# Patient Record
Sex: Female | Born: 1970 | Race: Asian | Hispanic: No | Marital: Married | Smoking: Never smoker
Health system: Southern US, Community
[De-identification: ages and names within clinical notes are randomized; demographics above are authoritative.]

## PROBLEM LIST (undated history)

## (undated) DIAGNOSIS — T7840XA Allergy, unspecified, initial encounter: Secondary | ICD-10-CM

## (undated) DIAGNOSIS — K219 Gastro-esophageal reflux disease without esophagitis: Secondary | ICD-10-CM

## (undated) DIAGNOSIS — R51 Headache: Secondary | ICD-10-CM

## (undated) DIAGNOSIS — R519 Headache, unspecified: Secondary | ICD-10-CM

## (undated) DIAGNOSIS — Z9889 Other specified postprocedural states: Secondary | ICD-10-CM

## (undated) HISTORY — DX: Allergy, unspecified, initial encounter: T78.40XA

## (undated) HISTORY — DX: Headache: R51

## (undated) HISTORY — DX: Headache, unspecified: R51.9

## (undated) HISTORY — DX: Gastro-esophageal reflux disease without esophagitis: K21.9

## (undated) HISTORY — DX: Other specified postprocedural states: Z98.890

---

## 2016-06-25 DIAGNOSIS — Z1231 Encounter for screening mammogram for malignant neoplasm of breast: Secondary | ICD-10-CM | POA: Diagnosis not present

## 2016-06-25 DIAGNOSIS — Z01419 Encounter for gynecological examination (general) (routine) without abnormal findings: Secondary | ICD-10-CM | POA: Diagnosis not present

## 2017-03-03 ENCOUNTER — Telehealth: Payer: Self-pay | Admitting: Internal Medicine

## 2017-03-03 NOTE — Telephone Encounter (Signed)
Copied from [place] [id number]. Topic: Appointment Scheduling - Scheduling Inquiry for Clinic >> Mar 03, 2017 11:22 AM Ether Griffins B wrote: Reason for CRM: pt would like to establish with Dr. Alain Marion. She was told he would make exceptions for russian speaking patients.

## 2017-03-03 NOTE — Telephone Encounter (Signed)
Would you be willing to see this patient to establish care?

## 2017-03-07 NOTE — Telephone Encounter (Signed)
Okay. Thank you.

## 2017-03-10 NOTE — Telephone Encounter (Signed)
Appointment scheduled.

## 2017-03-17 ENCOUNTER — Ambulatory Visit (INDEPENDENT_AMBULATORY_CARE_PROVIDER_SITE_OTHER): Payer: 59 | Admitting: Internal Medicine

## 2017-03-17 ENCOUNTER — Encounter: Payer: Self-pay | Admitting: Internal Medicine

## 2017-03-17 ENCOUNTER — Other Ambulatory Visit (INDEPENDENT_AMBULATORY_CARE_PROVIDER_SITE_OTHER): Payer: 59

## 2017-03-17 DIAGNOSIS — E039 Hypothyroidism, unspecified: Secondary | ICD-10-CM

## 2017-03-17 DIAGNOSIS — J309 Allergic rhinitis, unspecified: Secondary | ICD-10-CM

## 2017-03-17 DIAGNOSIS — K5900 Constipation, unspecified: Secondary | ICD-10-CM

## 2017-03-17 DIAGNOSIS — R635 Abnormal weight gain: Secondary | ICD-10-CM

## 2017-03-17 LAB — BASIC METABOLIC PANEL
BUN: 10 mg/dL (ref 6–23)
CO2: 26 mEq/L (ref 19–32)
CREATININE: 0.76 mg/dL (ref 0.40–1.20)
Calcium: 9.4 mg/dL (ref 8.4–10.5)
Chloride: 103 mEq/L (ref 96–112)
GFR: 87.35 mL/min (ref >60.00)
Glucose, Bld: 96 mg/dL (ref 70–99)
Potassium: 4.1 mEq/L (ref 3.5–5.1)
Sodium: 139 mEq/L (ref 135–145)

## 2017-03-17 LAB — T3, FREE: T3, Free: 3.3 pg/mL (ref 2.3–4.2)

## 2017-03-17 LAB — CBC WITH DIFFERENTIAL/PLATELET
BASOS ABS: 0.1 10*3/uL (ref 0.0–0.1)
Basophils Relative: 1.1 % (ref 0.0–3.0)
Eosinophils Absolute: 0.1 10*3/uL (ref 0.0–0.7)
Eosinophils Relative: 1.3 % (ref 0.0–5.0)
HCT: 41.9 % (ref 36.0–46.0)
Hemoglobin: 14 g/dL (ref 12.0–15.0)
LYMPHS ABS: 2.2 10*3/uL (ref 0.7–4.0)
Lymphocytes Relative: 29.3 % (ref 12.0–46.0)
MCHC: 33.4 g/dL (ref 30.0–36.0)
MCV: 88.4 fl (ref 78.0–100.0)
MONOS PCT: 6.9 % (ref 3.0–12.0)
Monocytes Absolute: 0.5 10*3/uL (ref 0.1–1.0)
NEUTROS ABS: 4.7 10*3/uL (ref 1.4–7.7)
NEUTROS PCT: 61.4 % (ref 43.0–77.0)
PLATELETS: 242 10*3/uL (ref 150.0–400.0)
RBC: 4.74 Mil/uL (ref 3.87–5.11)
RDW: 13.4 % (ref 11.5–15.5)
WBC: 7.6 10*3/uL (ref 4.0–10.5)

## 2017-03-17 LAB — URINALYSIS
BILIRUBIN URINE: NEGATIVE
Hgb urine dipstick: NEGATIVE
Ketones, ur: 15 — AB
LEUKOCYTES UA: NEGATIVE
NITRITE: NEGATIVE
PH: 6 (ref 5.0–8.0)
SPECIFIC GRAVITY, URINE: 1.025 (ref 1.000–1.030)
Total Protein, Urine: NEGATIVE
Urine Glucose: NEGATIVE
Urobilinogen, UA: 0.2 (ref 0.0–1.0)

## 2017-03-17 LAB — HEPATIC FUNCTION PANEL
ALBUMIN: 4.4 g/dL (ref 3.5–5.2)
ALT: 11 U/L (ref 0–35)
AST: 19 U/L (ref 0–37)
Alkaline Phosphatase: 55 U/L (ref 39–117)
BILIRUBIN TOTAL: 0.4 mg/dL (ref 0.2–1.2)
Bilirubin, Direct: 0 mg/dL (ref 0.0–0.3)
Total Protein: 7.7 g/dL (ref 6.0–8.3)

## 2017-03-17 LAB — LIPID PANEL
CHOL/HDL RATIO: 3
Cholesterol: 158 mg/dL (ref 0–200)
HDL: 51.8 mg/dL (ref >39.00)
LDL CALC: 93 mg/dL (ref 0–99)
NonHDL: 106.26
TRIGLYCERIDES: 67 mg/dL (ref 0.0–149.0)
VLDL: 13.4 mg/dL (ref 0.0–40.0)

## 2017-03-17 LAB — H. PYLORI ANTIBODY, IGG: H PYLORI IGG: POSITIVE — AB

## 2017-03-17 LAB — VITAMIN D 25 HYDROXY (VIT D DEFICIENCY, FRACTURES): VITD: 24.98 ng/mL — AB (ref 30.00–100.00)

## 2017-03-17 LAB — T4, FREE: Free T4: 0.95 ng/dL (ref 0.60–1.60)

## 2017-03-17 LAB — VITAMIN B12: Vitamin B-12: 515 pg/mL (ref 211–911)

## 2017-03-17 LAB — TSH: TSH: 3.37 u[IU]/mL (ref 0.35–4.50)

## 2017-03-17 MED ORDER — LORATADINE 10 MG PO TABS: 10.0000 mg | ORAL_TABLET | Freq: Every day | ORAL | 3 refills | Status: AC

## 2017-03-17 MED ORDER — POLYETHYLENE GLYCOL 3350 17 GM/SCOOP PO POWD
17.0000 g | Freq: Two times a day (BID) | ORAL | 11 refills | Status: DC | PRN
Start: 2017-03-17 — End: 2020-05-29

## 2017-03-17 NOTE — Progress Notes (Signed)
   Subjective:  Patient ID: Dawn [name], female    DOB: [birth year]  Age: 47 y.o. MRN: [id number]  CC: No chief complaint on file.   HPI   Dashanique Rhody presents for a new pt visit C/o hypothyroidism since last summer. The pt had an [place] - mild goiter and a low FT3 in [place]. She ran out of her thyroid Rx since Nov 2018 C/o memory issues, wt gain, constipation off her thyroid Rx   Outpatient Medications Prior to Visit  Medication Sig Dispense Refill  . levonorgestrel (MIRENA, 52 MG,) 20 MCG/24HR IUD Mirena 20 mcg/24 hr (5 years) intrauterine device  Take 1 device by intrauterine route.     No facility-administered medications prior to visit.     ROS Review of Systems  Constitutional: Negative for activity change, appetite change, chills, fatigue and unexpected weight change.  HENT: Negative for congestion, mouth sores and sinus pressure.   Eyes: Negative for visual disturbance.  Respiratory: Negative for cough and chest tightness.   Gastrointestinal: Negative for abdominal pain and nausea.  Genitourinary: Negative for difficulty urinating, frequency and vaginal pain.  Musculoskeletal: Negative for back pain and gait problem.  Skin: Negative for pallor and rash.  Neurological: Negative for dizziness, tremors, weakness, numbness and headaches.  Psychiatric/Behavioral: Negative for confusion, sleep disturbance and suicidal ideas.    Objective:  BP 118/62 (BP Location: Left Arm, Patient Position: Sitting, Cuff Size: Large)   Pulse 72   Temp 98.2 F (36.8 C) (Oral)   Ht 5' 5.25" (1.657 m)   Wt 158 lb (71.7 kg)   SpO2 99%   BMI 26.09 kg/m   BP Readings from Last 3 Encounters:  03/17/17 118/62    Wt Readings from Last 3 Encounters:  03/17/17 158 lb (71.7 kg)    Physical Exam  Constitutional: She appears well-developed. No distress.  HENT:  Head: Normocephalic.  Right Ear: External ear normal.  Left Ear: External ear normal.  Nose: Nose normal.    Mouth/Throat: Oropharynx is clear and moist.  Eyes: Conjunctivae are normal. Pupils are equal, round, and reactive to light. Right eye exhibits no discharge. Left eye exhibits no discharge.  Neck: Normal range of motion. Neck supple. No JVD present. No tracheal deviation present. No thyromegaly present.  Cardiovascular: Normal rate, regular rhythm and normal heart sounds.  Pulmonary/Chest: No stridor. No respiratory distress. She has no wheezes.  Abdominal: Soft. Bowel sounds are normal. She exhibits no distension and no mass. There is no tenderness. There is no rebound and no guarding.  Musculoskeletal: She exhibits tenderness. She exhibits no edema.  Lymphadenopathy:    She has no cervical adenopathy.  Neurological: She displays normal reflexes. No cranial nerve deficit. She exhibits normal muscle tone. Coordination normal.  Skin: No rash noted. No erythema.  Psychiatric: She has a normal mood and affect. Her behavior is normal. Judgment and thought content normal.  mild goiter NT  No results found for: WBC, HGB, HCT, PLT, GLUCOSE, CHOL, TRIG, HDL, LDLDIRECT, LDLCALC, ALT, AST, NA, K, CL, CREATININE, BUN, CO2, TSH, PSA, INR, GLUF, HGBA1C, MICROALBUR  Patient was never admitted.  Assessment & Plan:   There are no diagnoses linked to this encounter. I am having Kandyce Mcphie maintain her levonorgestrel.  No orders of the defined types were placed in this encounter.    Follow-up: No Follow-up on file.  Walker Kehr, MD

## 2017-03-17 NOTE — Assessment & Plan Note (Signed)
Claritin

## 2017-03-17 NOTE — Assessment & Plan Note (Addendum)
Intermittent  Labs  Miralax po. May need a GI ref

## 2017-03-17 NOTE — Patient Instructions (Signed)
Use [employer] Peroxicare tooth paste   Gluten free trial for 4-6 weeks. OK to use gluten-free bread and gluten-free pasta.    Gluten-Free Diet for Celiac Disease, Adult The gluten-free diet includes all foods that do not contain gluten. Gluten is a protein that is found in wheat, rye, barley, and some other grains. Following the gluten-free diet is the only treatment for people with celiac disease. It helps to prevent damage to the intestines and improves or eliminates the symptoms of celiac disease. Following the gluten-free diet requires some planning. It can be challenging at first, but it gets easier with time and practice. There are more gluten-free options available today than ever before. If you need help finding gluten-free foods or if you have questions, talk with your diet and nutrition specialist (registered dietitian) or your health care provider. What do I need to know about a gluten-free diet?  All fruits, vegetables, and meats are safe to eat and do not contain gluten.  When grocery shopping, start by shopping in the produce, meat, and dairy sections. These sections are more likely to contain gluten-free foods. Then move to the aisles that contain packaged foods if you need to.  Read all food labels. Gluten is often added to foods. Always check the ingredient list and look for warnings, such as "may contain gluten."  Talk with your dietitian or health care provider before taking a gluten-free multivitamin or mineral supplement.  Be aware of gluten-free foods having contact with foods that contain gluten (cross-contamination). This can happen at home and with any processed foods. ? Talk with your health care provider or dietitian about how to reduce the risk of cross-contamination in your home. ? If you have questions about how a food is processed, ask the manufacturer. What key words help to identify gluten? Foods that list any of these key words on the label usually contain  gluten:  Wheat, flour, enriched flour, bromated flour, white flour, durum flour, graham flour, phosphated flour, self-rising flour, semolina, farina, barley (malt), rye, and oats.  Starch, dextrin, modified food starch, or cereal.  Thickening, fillers, or emulsifiers.  Malt flavoring, malt extract, or malt syrup.  Hydrolyzed vegetable protein.  In the [place] packaged foods that are gluten-free are required to be labeled "GF." These foods should be easy to identify and are safe to eat. In the [place] food companies are also required to list common food allergens, including wheat, on their labels. Recommended foods Grains  Amaranth, bean flours, 100% buckwheat flour, corn, millet, nut flours or nut meals, GF oats, quinoa, rice, sorghum, teff, rice wafers, pure cornmeal tortillas, popcorn, and hot cereals made from cornmeal. Hominy, rice, wild rice. Some Asian rice noodles or bean noodles. Arrowroot starch, corn bran, corn flour, corn germ, cornmeal, corn starch, potato flour, potato starch flour, and rice bran. Plain, brown, and sweet rice flours. Rice polish, soy flour, and tapioca starch. Vegetables  All plain fresh, frozen, and canned vegetables. Fruits  All plain fresh, frozen, canned, and dried fruits, and 100% fruit juices. Meats and other protein foods  All fresh beef, pork, poultry, fish, seafood, and eggs. Fish canned in water, oil, brine, or vegetable broth. Plain nuts and seeds, peanut butter. Some lunch meat and some frankfurters. Dried beans, dried peas, and lentils. Dairy  Fresh plain, dry, evaporated, or condensed milk. Cream, butter, sour cream, whipping cream, and most yogurts. Unprocessed cheese, most processed cheeses, some cottage cheese, some cream cheeses. Beverages  Coffee, tea, most herbal teas. Carbonated  beverages and some root beers. Wine, sake, and distilled spirits, such as gin, vodka, and whiskey. Most hard ciders. Fats and oils  Butter, margarine, vegetable  oil, hydrogenated butter, olive oil, shortening, lard, cream, and some mayonnaise. Some commercial salad dressings. Olives. Sweets and desserts  Sugar, honey, some syrups, molasses, jelly, and jam. Plain hard candy, marshmallows, and gumdrops. Pure cocoa powder. Plain chocolate. Custard and some pudding mixes. Gelatin desserts, sorbets, frozen ice pops, and sherbet. Cake, cookies, and other desserts prepared with allowed flours. Some commercial ice creams. Cornstarch, tapioca, and rice puddings. Seasoning and other foods  Some canned or frozen soups. Monosodium glutamate (MSG). Cider, rice, and wine vinegar. Baking soda and baking powder. Cream of tartar. Baking and nutritional yeast. Certain soy sauces made without wheat (ask your dietitian about specific brands that are allowed). Nuts, coconut, and chocolate. Salt, pepper, herbs, spices, flavoring extracts, imitation or artificial flavorings, natural flavorings, and food colorings. Some medicines and supplements. Some lip glosses and other cosmetics. Rice syrups. The items listed may not be a complete list. Talk with your dietitian about what dietary choices are best for you. Foods to avoid Grains  Barley, bran, bulgur, couscous, cracked wheat, [place], farro, graham, malt, matzo, semolina, wheat germ, and all wheat and rye cereals including spelt and kamut. Cereals containing malt as a flavoring, such as rice cereal. Noodles, spaghetti, macaroni, most packaged rice mixes, and all mixes containing wheat, rye, barley, or triticale. Vegetables  Most creamed vegetables and most vegetables canned in sauces. Some commercially prepared vegetables and salads. Fruits  Thickened or prepared fruits and some pie fillings. Some fruit snacks and fruit roll-ups. Meats and other protein foods  Any meat or meat alternative containing wheat, rye, barley, or gluten stabilizers. These are often marinated or packaged meats and lunch meats. Bread-containing  products, such as Swiss steak, croquettes, meatballs, and meatloaf. Most tuna canned in vegetable broth and [place] with hydrolyzed vegetable protein (HVP) injected as part of the basting. Seitan. Imitation fish. Eggs in sauces made from ingredients to avoid. Dairy  Commercial chocolate milk drinks and malted milk. Some non-dairy creamers. Any cheese product containing ingredients to avoid. Beverages  Certain cereal beverages. Beer, ale, malted milk, and some root beers. Some hard ciders. Some instant flavored coffees. Some herbal teas made with barley or with barley malt added. Fats and oils  Some commercial salad dressings. Sour cream containing modified food starch. Sweets and desserts  Some toffees. Chocolate-coated nuts (may be rolled in wheat flour) and some commercial candies and candy bars. Most cakes, cookies, donuts, pastries, and other baked goods. Some commercial ice cream. Ice cream cones. Commercially prepared mixes for cakes, cookies, and other desserts. Bread pudding and other puddings thickened with flour. Products containing brown rice syrup made with barley malt enzyme. Desserts and sweets made with malt flavoring. Seasoning and other foods  Some curry powders, some dry seasoning mixes, some gravy extracts, some meat sauces, some ketchups, some prepared mustards, and horseradish. Certain soy sauces. Malt vinegar. Bouillon and bouillon cubes that contain HVP. Some chip dips, and some chewing gum. Yeast extract. Brewer's yeast. Caramel color. Some medicines and supplements. Some lip glosses and other cosmetics. The items listed may not be a complete list. Talk with your dietitian about what dietary choices are best for you. Summary  Gluten is a protein that is found in wheat, rye, barley, and some other grains. The gluten-free diet includes all foods that do not contain gluten.  If you need help  finding gluten-free foods or if you have questions, talk with your diet and nutrition  specialist (registered dietitian) or your health care provider.  Read all food labels. Gluten is often added to foods. Always check the ingredient list and look for warnings, such as "may contain gluten." This information is not intended to replace advice given to you by your health care provider. Make sure you discuss any questions you have with your health care provider. Document Released: 01/19/2005 Document Revised: 11/04/2015 Document Reviewed: 11/04/2015 Elsevier Interactive Patient Education  2018 [employer].

## 2017-03-17 NOTE — Assessment & Plan Note (Signed)
Labs

## 2017-03-17 NOTE — Assessment & Plan Note (Signed)
Hypothyroidism since last summer 2018. The pt had an [place] - mild goiter and a low FT3 in [place]. She ran out of her thyroid Rx since Nov 2018 She was on Levothr 50 mc/g x 3 mo   Labs

## 2017-03-20 ENCOUNTER — Other Ambulatory Visit: Payer: Self-pay | Admitting: Internal Medicine

## 2017-03-20 MED ORDER — METRONIDAZOLE 500 MG PO TABS: 500.0000 mg | ORAL_TABLET | Freq: Three times a day (TID) | ORAL | 0 refills | Status: DC

## 2017-03-20 MED ORDER — VITAMIN D3 1.25 MG (50000 UT) PO CAPS: 1.0000 | ORAL_CAPSULE | ORAL | 0 refills | Status: DC

## 2017-03-20 MED ORDER — CLARITHROMYCIN 500 MG PO TABS: 500.0000 mg | ORAL_TABLET | Freq: Two times a day (BID) | ORAL | 0 refills | Status: AC

## 2017-03-20 MED ORDER — PANTOPRAZOLE SODIUM 40 MG PO TBEC: 40.0000 mg | DELAYED_RELEASE_TABLET | Freq: Two times a day (BID) | ORAL | 1 refills | Status: DC

## 2017-03-20 MED ORDER — VITAMIN D3 50 MCG (2000 UT) PO CAPS: 2000.0000 [IU] | ORAL_CAPSULE | Freq: Every day | ORAL | 3 refills | Status: AC

## 2017-04-28 ENCOUNTER — Ambulatory Visit: Payer: 59 | Admitting: Internal Medicine

## 2017-04-28 ENCOUNTER — Encounter: Payer: Self-pay | Admitting: Internal Medicine

## 2017-04-28 VITALS — BP 116/58 | HR 63 | Temp 98.2°F | Ht 65.25 in | Wt 162.0 lb

## 2017-04-28 DIAGNOSIS — E039 Hypothyroidism, unspecified: Secondary | ICD-10-CM

## 2017-04-28 DIAGNOSIS — K5900 Constipation, unspecified: Secondary | ICD-10-CM | POA: Diagnosis not present

## 2017-04-28 DIAGNOSIS — B9681 Helicobacter pylori [H. pylori] as the cause of diseases classified elsewhere: Secondary | ICD-10-CM | POA: Diagnosis not present

## 2017-04-28 DIAGNOSIS — K2950 Unspecified chronic gastritis without bleeding: Secondary | ICD-10-CM

## 2017-04-28 DIAGNOSIS — R1013 Epigastric pain: Secondary | ICD-10-CM

## 2017-04-28 DIAGNOSIS — E559 Vitamin D deficiency, unspecified: Secondary | ICD-10-CM | POA: Insufficient documentation

## 2017-04-28 MED ORDER — HYOSCYAMINE SULFATE 0.125 MG PO TABS: 0.1250 mg | ORAL_TABLET | ORAL | 3 refills | Status: DC | PRN

## 2017-04-28 NOTE — Assessment & Plan Note (Signed)
On Rx 

## 2017-04-28 NOTE — Assessment & Plan Note (Addendum)
Treated w/abx - better Abd [place] GI ref if not better

## 2017-04-28 NOTE — Assessment & Plan Note (Signed)
On Vit D 

## 2017-04-28 NOTE — Assessment & Plan Note (Signed)
Better on gluten free diet 

## 2017-04-28 NOTE — Progress Notes (Signed)
Subjective:  Patient ID: Dawn [name], female    DOB: 1970/11/08  Age: 47 y.o. MRN: [id number]  CC: No chief complaint on file.   HPI Dawn [name] presents for H pylori, epig pain, ?gluten intolerance f/u. Took abx. C/o occasional epigastric pain, gas - better...   Outpatient Medications Prior to Visit  Medication Sig Dispense Refill  . Cholecalciferol (VITAMIN D3) 2000 units capsule Take 1 capsule (2,000 Units total) by mouth daily. 100 capsule 3  . levonorgestrel (MIRENA, 52 MG,) 20 MCG/24HR IUD Mirena 20 mcg/24 hr (5 years) intrauterine device  Take 1 device by intrauterine route.    . loratadine (CLARITIN) 10 MG tablet Take 1 tablet (10 mg total) by mouth daily. 90 tablet 3  . pantoprazole (PROTONIX) 40 MG tablet Take 1 tablet (40 mg total) by mouth 2 (two) times daily. 60 tablet 1  . polyethylene glycol powder (GLYCOLAX/MIRALAX) powder Take 17 g by mouth 2 (two) times daily as needed for moderate constipation. 500 g 11  . Cholecalciferol (VITAMIN D3) 50000 units CAPS Take 1 capsule by mouth once a week. 6 capsule 0  . metroNIDAZOLE (FLAGYL) 500 MG tablet Take 1 tablet (500 mg total) by mouth 3 (three) times daily. 42 tablet 0   No facility-administered medications prior to visit.     ROS Review of Systems  Constitutional: Negative for activity change, appetite change, chills, fatigue and unexpected weight change.  HENT: Negative for congestion, mouth sores and sinus pressure.   Eyes: Negative for visual disturbance.  Respiratory: Negative for cough and chest tightness.   Gastrointestinal: Positive for abdominal pain. Negative for nausea.  Genitourinary: Negative for difficulty urinating, frequency and vaginal pain.  Musculoskeletal: Negative for back pain and gait problem.  Skin: Negative for pallor and rash.  Neurological: Negative for dizziness, tremors, weakness, numbness and headaches.  Psychiatric/Behavioral: Negative for confusion and sleep disturbance.     Objective:  BP (!) 116/58 (BP Location: Left Arm, Patient Position: Sitting, Cuff Size: Large)   Pulse 63   Temp 98.2 F (36.8 C) (Oral)   Ht 5' 5.25" (1.657 m)   Wt 162 lb (73.5 kg)   SpO2 99%   BMI 26.75 kg/m   BP Readings from Last 3 Encounters:  04/28/17 (!) 116/58  03/17/17 118/62    Wt Readings from Last 3 Encounters:  04/28/17 162 lb (73.5 kg)  03/17/17 158 lb (71.7 kg)    Physical Exam  Constitutional: She appears well-developed. No distress.  HENT:  Head: Normocephalic.  Right Ear: External ear normal.  Left Ear: External ear normal.  Nose: Nose normal.  Mouth/Throat: Oropharynx is clear and moist.  Eyes: Pupils are equal, round, and reactive to light. Conjunctivae are normal. Right eye exhibits no discharge. Left eye exhibits no discharge.  Neck: Normal range of motion. Neck supple. No JVD present. No tracheal deviation present. No thyromegaly present.  Cardiovascular: Normal rate, regular rhythm and normal heart sounds.  Pulmonary/Chest: No stridor. No respiratory distress. She has no wheezes.  Abdominal: Soft. Bowel sounds are normal. She exhibits no distension and no mass. There is no tenderness. There is no rebound and no guarding.  Musculoskeletal: She exhibits no edema or tenderness.  Lymphadenopathy:    She has no cervical adenopathy.  Neurological: She displays normal reflexes. No cranial nerve deficit. She exhibits normal muscle tone. Coordination normal.  Skin: No rash noted. No erythema.  Psychiatric: She has a normal mood and affect. Her behavior is normal. Judgment and thought content normal.  Lab Results  Component Value Date   WBC 7.6 03/17/2017   HGB 14.0 03/17/2017   HCT 41.9 03/17/2017   PLT 242.0 03/17/2017   GLUCOSE 96 03/17/2017   CHOL 158 03/17/2017   TRIG 67.0 03/17/2017   HDL 51.80 03/17/2017   LDLCALC 93 03/17/2017   ALT 11 03/17/2017   AST 19 03/17/2017   NA 139 03/17/2017   K 4.1 03/17/2017   CL 103 03/17/2017    CREATININE 0.76 03/17/2017   BUN 10 03/17/2017   CO2 26 03/17/2017   TSH 3.37 03/17/2017    Patient was never admitted.  Assessment & Plan:   Diagnoses and all orders for this visit:  Chronic Helicobacter pylori gastritis  Vitamin D deficiency  Constipation, unspecified constipation type  Acquired hypothyroidism   I have discontinued Dacota Bain's metroNIDAZOLE. I am also having her maintain her levonorgestrel, loratadine, polyethylene glycol powder, Vitamin D3, and pantoprazole.  No orders of the defined types were placed in this encounter.    Follow-up: No follow-ups on file.  Walker Kehr, MD

## 2017-04-30 NOTE — Addendum Note (Signed)
Addended by: Karren Cobble on: 04/30/2017 08:59 AM   Modules accepted: Orders

## 2017-05-18 ENCOUNTER — Other Ambulatory Visit: Payer: Self-pay | Admitting: Internal Medicine

## 2017-05-21 ENCOUNTER — Ambulatory Visit
Admission: RE | Admit: 2017-05-21 | Discharge: 2017-05-21 | Disposition: A | Discharge status: Home / Self Care | Payer: 59 | Source: Ambulatory Visit | Attending: Internal Medicine | Admitting: Internal Medicine

## 2017-05-21 DIAGNOSIS — R1013 Epigastric pain: Secondary | ICD-10-CM

## 2017-05-22 ENCOUNTER — Encounter (INDEPENDENT_AMBULATORY_CARE_PROVIDER_SITE_OTHER): Payer: Self-pay

## 2017-06-30 DIAGNOSIS — Z01419 Encounter for gynecological examination (general) (routine) without abnormal findings: Secondary | ICD-10-CM | POA: Diagnosis not present

## 2017-06-30 DIAGNOSIS — Z124 Encounter for screening for malignant neoplasm of cervix: Secondary | ICD-10-CM | POA: Diagnosis not present

## 2017-06-30 DIAGNOSIS — Z1231 Encounter for screening mammogram for malignant neoplasm of breast: Secondary | ICD-10-CM | POA: Diagnosis not present

## 2017-07-22 DIAGNOSIS — Z3040 Encounter for surveillance of contraceptives, unspecified: Secondary | ICD-10-CM | POA: Diagnosis not present

## 2017-08-11 ENCOUNTER — Encounter: Payer: Self-pay | Admitting: Internal Medicine

## 2017-08-15 ENCOUNTER — Other Ambulatory Visit: Payer: Self-pay | Admitting: Internal Medicine

## 2017-08-15 DIAGNOSIS — M25519 Pain in unspecified shoulder: Secondary | ICD-10-CM

## 2017-08-15 DIAGNOSIS — R21 Rash and other nonspecific skin eruption: Secondary | ICD-10-CM

## 2017-09-03 NOTE — Progress Notes (Signed)
[name] [address], [place] [address] Phone: [phone] Subjective:    I'm seeing this patient by the request  of:  Plotnikov, [name], MD   CC: Right shoulder pain, bilateral knee pain, back pain  [id number]  Dawn [name] is a 47 y.o. female coming in with complaint of right shoulder and bilateral knee pain. Right knee is most painful. Back pain also noted. No arthritis noted. Right side sleeper. Started playing tennis in the spring. No numbness and tingling noted in hands or toes. Lower back pain is a constant dull pain. Gets worse with straining or lack of exercise. Back is stiff in the morning.   Onset- Chronic Location- knee (deep), shoulder (AC joint) Duration-  Character- knee sharp short pain, dull, shoulder dull, intense with tennis Aggravating factors- twisting at the knee, weight bearing, sleeping on her right side Reliving factors- Ibuprofen  Therapies tried-  Severity-     Past Medical History:  Diagnosis Date  . Allergy   . Frequent headaches   . GERD (gastroesophageal reflux disease)   . H/O sinus surgery    No past surgical history on file. Social History   Socioeconomic History  . Marital status: Married    Spouse name: Not on file  . Number of children: 3  . Years of education: Not on file  . Highest education level: Master's degree (e.g., MA, MS, MEng, MEd, MSW, MBA)  Occupational History  . Not on file  Social Needs  . Financial resource strain: Not on file  . Food insecurity:    Worry: Not on file    Inability: Not on file  . Transportation needs:    Medical: Not on file    Non-medical: Not on file  Tobacco Use  . Smoking status: Never Smoker  . Smokeless tobacco: Never Used  Substance and Sexual Activity  . Alcohol use: Yes  . Drug use: No  . Sexual activity: Yes    Birth control/protection: IUD  Lifestyle  . Physical activity:    Days per week: Not on file    Minutes per  session: Not on file  . Stress: Not on file  Relationships  . Social connections:    Talks on phone: Not on file    Gets together: Not on file    Attends religious service: Not on file    Active member of club or organization: Not on file    Attends meetings of clubs or organizations: Not on file    Relationship status: Not on file  Other Topics Concern  . Not on file  Social History Narrative  . Not on file   Allergies  Allergen Reactions  . Amoxicillin    Family History  Problem Relation Age of Onset  . Asthma Mother   . Hyperlipidemia Mother   . Cancer Maternal Grandmother   . Hypertension Maternal Grandmother   . Kidney disease Maternal Grandmother   . Cancer Maternal Grandfather   . Early death Paternal Grandmother   . Diabetes Paternal Grandfather   . Arthritis Paternal Grandfather   . Hypertension Paternal Grandfather      Past medical history, social, surgical and family history all reviewed in electronic medical record.  No pertanent information unless stated regarding to the chief complaint.   Review of Systems:Review of systems updated and as accurate as of 09/06/17  No headache, visual changes, nausea, vomiting, diarrhea, constipation, dizziness, abdominal pain, skin rash, fevers, chills, night sweats, weight  loss, swollen lymph nodes, body aches, joint swelling, chest pain, shortness of breath, mood changes.  Positive muscle aches  Objective  Blood pressure 100/70, pulse 76, height 5' 5.25" (1.657 m), weight 155 lb (70.3 kg), SpO2 98 %. Systems examined below as of 09/06/17   General: No apparent distress alert and oriented x3 mood and affect normal, dressed appropriately.  HEENT: Pupils equal, extraocular movements intact  Respiratory: Patient's speak in full sentences and does not appear short of breath  Cardiovascular: No lower extremity edema, non tender, no erythema  Skin: Warm dry intact with no signs of infection or rash on extremities or on axial  skeleton.  Abdomen: Soft nontender  Neuro: Cranial nerves II through XII are intact, neurovascularly intact in all extremities with 2+ DTRs and 2+ pulses.  Lymph: No lymphadenopathy of posterior or anterior cervical chain or axillae bilaterally.  Gait normal with good balance and coordination.  MSK:  Non tender with full range of motion and good stability and symmetric strength and tone of elbows, wrist, hip, and ankles bilaterally.   Shoulder: Right Inspection reveals no abnormalities, atrophy or asymmetry. Palpation tender over the acromioclavicular joint ROM is full in all planes. Rotator cuff strength normal throughout. No signs of impingement with negative Neer and Hawkin's tests, empty can sign. Speeds and Yergason's tests normal. No labral pathology noted with negative Obrien's, negative clunk and good stability.  Positive crossover Normal scapular function observed. No painful arc and no drop arm sign. No apprehension sign Contralateral shoulder unremarkable  Knee: Bilateral Normal to inspection with no erythema or effusion or obvious bony abnormalities. Palpation the patellofemoral ROM full in flexion and extension and lower leg rotation. Ligaments with solid consistent endpoints including ACL, PCL, LCL, MCL. Negative Mcmurray's, Apley's, and Thessalonian tests. Mild painful patellar compression. Patellar glide with moderate crepitus. Patellar and quadriceps tendons unremarkable. Hamstring and quadriceps strength is normal.   MSK US performed of: Right knee This study was ordered, performed, and interpreted by Charlann Boxer D.O.  Knee: Right knee pain shows trace effusion noted.  Patient does have some mild patellofemoral narrowing.  Medial and lateral joint space looks fairly unremarkable.   IMPRESSION: Patellofemoral   Impression and Recommendations:     This case required medical decision making of moderate complexity.      Note: This dictation was prepared  with Dragon dictation along with smaller phrase technology. Any transcriptional errors that result from this process are unintentional.

## 2017-09-06 ENCOUNTER — Ambulatory Visit: Payer: Self-pay

## 2017-09-06 ENCOUNTER — Encounter: Payer: Self-pay | Admitting: Family Medicine

## 2017-09-06 ENCOUNTER — Ambulatory Visit (INDEPENDENT_AMBULATORY_CARE_PROVIDER_SITE_OTHER)
Admission: RE | Admit: 2017-09-06 | Discharge: 2017-09-06 | Disposition: A | Discharge status: Home / Self Care | Payer: 59 | Source: Ambulatory Visit | Attending: Family Medicine | Admitting: Family Medicine

## 2017-09-06 ENCOUNTER — Ambulatory Visit: Payer: 59 | Admitting: Family Medicine

## 2017-09-06 VITALS — BP 100/70 | HR 76 | Ht 65.25 in | Wt 155.0 lb

## 2017-09-06 DIAGNOSIS — M25511 Pain in right shoulder: Principal | ICD-10-CM

## 2017-09-06 DIAGNOSIS — E559 Vitamin D deficiency, unspecified: Secondary | ICD-10-CM

## 2017-09-06 DIAGNOSIS — G8929 Other chronic pain: Secondary | ICD-10-CM

## 2017-09-06 DIAGNOSIS — M222X1 Patellofemoral disorders, right knee: Secondary | ICD-10-CM

## 2017-09-06 DIAGNOSIS — M222X2 Patellofemoral disorders, left knee: Secondary | ICD-10-CM

## 2017-09-06 DIAGNOSIS — M25561 Pain in right knee: Secondary | ICD-10-CM

## 2017-09-06 DIAGNOSIS — M545 Low back pain, unspecified: Secondary | ICD-10-CM | POA: Insufficient documentation

## 2017-09-06 DIAGNOSIS — M170 Bilateral primary osteoarthritis of knee: Secondary | ICD-10-CM | POA: Diagnosis not present

## 2017-09-06 MED ORDER — DICLOFENAC SODIUM 2 % TD SOLN: 2.0000 g | Freq: Two times a day (BID) | TRANSDERMAL | 3 refills | Status: DC

## 2017-09-06 MED ORDER — VITAMIN D (ERGOCALCIFEROL) 1.25 MG (50000 UNIT) PO CAPS: 50000.0000 [IU] | ORAL_CAPSULE | ORAL | 0 refills | Status: DC

## 2017-09-06 NOTE — Assessment & Plan Note (Signed)
Patellofemoral Syndrome  Reviewed anatomy using anatomical model and how PFS occurs.  Given rehab exercises handout for VMO, hip abductors, core, entire kinetic chain including proprioception exercises including cone touches, step downs, hip elevations and turn outs.  Could benefit from PT, regular exercise, upright biking, and a PFS knee brace to assist with tracking abnormalities. Discussed possible stripping of the knee as well.  Patient will follow-up again in 4 weeks

## 2017-09-06 NOTE — Patient Instructions (Signed)
Good to see you  Dawn [name] is your friend. Ice 20 minutes 2 times daily. Usually after activity and before bed. pennsaid pinkie amount topically 2 times daily as needed.  Exercises 3 times a week.  Keep hands within peripheral vision  Xrays downstairs Stay active but biking and lower impact woud be helpful  Once weekly vitamin D for 12 weeks and stop daily vitamin D for now.  Turmeric 500mg  daily  See me again in 4 weeks

## 2017-09-06 NOTE — Assessment & Plan Note (Signed)
Home exercise given, discussed icing regimen, discussed which activities to do which wants to avoid.  Increase activity as tolerated.  Follow-up in 4 to 6 weeks

## 2017-09-06 NOTE — Assessment & Plan Note (Signed)
Chronic low back pain, will consider further work-up including home exercises, x-rays ordered today though for further evaluation.  Follow-up again in 4 weeks

## 2017-09-06 NOTE — Assessment & Plan Note (Signed)
Once weekly vitamin D given. 

## 2017-09-24 ENCOUNTER — Telehealth: Payer: Self-pay | Admitting: Internal Medicine

## 2017-09-24 NOTE — Telephone Encounter (Signed)
Copied from [facility]. Topic: Quick Communication - Rx Refill/Question >> Sep 24, 2017 11:43 AM Oliver Pila B wrote: Medication: Diclofenac Sodium (PENNSAID) 2 % SOLN [[id number]]   The pharmacy is needing clinical notes for the medicaton above; contact to advise  [facility]: [phone]

## 2017-10-06 ENCOUNTER — Encounter: Payer: Self-pay | Admitting: Family Medicine

## 2017-10-06 ENCOUNTER — Ambulatory Visit: Payer: 59 | Admitting: Family Medicine

## 2017-10-06 DIAGNOSIS — M222X2 Patellofemoral disorders, left knee: Secondary | ICD-10-CM

## 2017-10-06 DIAGNOSIS — M545 Low back pain, unspecified: Secondary | ICD-10-CM

## 2017-10-06 DIAGNOSIS — M25511 Pain in right shoulder: Secondary | ICD-10-CM | POA: Diagnosis not present

## 2017-10-06 DIAGNOSIS — G8929 Other chronic pain: Secondary | ICD-10-CM

## 2017-10-06 DIAGNOSIS — M222X1 Patellofemoral disorders, right knee: Secondary | ICD-10-CM

## 2017-10-06 NOTE — Assessment & Plan Note (Signed)
Improvement noted.  No significant change, discussed icing regimen.  Continue the vitamin D.  Discussed if worsening symptoms consider injection follow-up 2 months

## 2017-10-06 NOTE — Patient Instructions (Signed)
Good to see you  Ice is your friend  OK to play tennis just ice after and do the stretches.  Calcium pyruvate 1500mg  daily or around the time of symtoms each month.  Continue the tumeric I think it is helping See me again in 2 months

## 2017-10-06 NOTE — Progress Notes (Signed)
[facility] [place], Dawn [name] [address] Phone: [phone] Subjective:   Dawn Dawn [name], am serving as a scribe for Dr. Hulan [name].    CC: Knee pain, shoulder pain follow-up  WER:[id number]  Dawn Dawn [name] is a 47 y.o. female coming in with complaint of shoulder and knee pain. She is feeling improvement in both areas. Her shoulder still bothers her while lying down but it takes a longer time for the pain to come on. Has not played tennis much this summer so has not felt the pain as much.  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  Mild pain overall.  Patient states his only mild pain at night.  Her knees have not been bothering her since last visit. Describes intermittent pain but none recently.       Past Medical History:  Diagnosis Date  . Allergy   . Frequent headaches   . GERD (gastroesophageal reflux disease)   . H/O sinus surgery    Dawn [name] past surgical history on file. Social History   Socioeconomic History  . Marital status: Married    Spouse name: Not on file  . Number of children: 3  . Years of education: Not on file  . Highest education level: Master's degree (e.g., MA, MS, MEng, MEd, MSW, MBA)  Occupational History  . Not on file  Social Needs  . Financial resource strain: Not on file  . Food insecurity:    Worry: Not on file    Inability: Not on file  . Transportation needs:    Medical: Not on file    Non-medical: Not on file  Tobacco Use  . Smoking status: Never Smoker  . Smokeless tobacco: Never Used  Substance and Sexual Activity  . Alcohol use: Yes  . Drug use: Dawn [name]  . Sexual activity: Yes    Birth control/protection: IUD  Lifestyle  . Physical activity:    Days per week: Not on file    Minutes per session: Not on file  . Stress: Not on file  Relationships  . Social connections:    Talks on phone: Not on file    Gets together: Not on file    Attends religious service: Not on file      Active member of club or organization: Not on file    Attends meetings of clubs or organizations: Not on file    Relationship status: Not on file  Other Topics Concern  . Not on file  Social History Narrative  . Not on file   Allergies  Allergen Reactions  . Amoxicillin    Family History  Problem Relation Age of Onset  . Asthma Mother   . Hyperlipidemia Mother   . Cancer Maternal Grandmother   . Hypertension Maternal Grandmother   . Kidney disease Maternal Grandmother   . Cancer Maternal Grandfather   . Early death Paternal Grandmother   . Diabetes Paternal Grandfather   . Arthritis Paternal Grandfather   . Hypertension Paternal Grandfather     Current Outpatient Medications (Endocrine & Metabolic):  .  levonorgestrel (MIRENA, 52 MG,) 20 MCG/24HR IUD, Mirena 20 mcg/24 hr (5 years) intrauterine device  Take 1 device by intrauterine route.   Current Outpatient Medications (Respiratory):  .  loratadine (CLARITIN) 10 MG tablet, Take 1 tablet (10 mg total) by mouth daily.    Current Outpatient Medications (Other):  Marland Kitchen  Cholecalciferol (VITAMIN D3) 2000 units capsule, Take 1 capsule (2,000 Units total) by mouth  daily. .  Diclofenac Sodium (PENNSAID) 2 % SOLN, Place 2 g onto the skin 2 (two) times daily. .  pantoprazole (PROTONIX) 40 MG tablet, TAKE 1 TABLET BY MOUTH TWICE A DAY .  polyethylene glycol powder (GLYCOLAX/MIRALAX) powder, Take 17 g by mouth 2 (two) times daily as needed for moderate constipation. .  Vitamin D, Ergocalciferol, (DRISDOL) 50000 units CAPS capsule, Take 1 capsule (50,000 Units total) by mouth every 7 (seven) days. .  hyoscyamine (LEVSIN, ANASPAZ) 0.125 MG tablet, Take 1-2 tablets (0.125-0.25 mg total) by mouth every 4 (four) hours as needed for up to 10 days for cramping.    Past medical history, social, surgical and family history all reviewed in electronic medical record.  Dawn [name] pertanent information unless stated regarding to the chief complaint.    Review of Systems:  Dawn [name] headache, visual changes, nausea, vomiting, diarrhea, constipation, dizziness, abdominal pain, skin rash, fevers, chills, night sweats, weight loss, swollen lymph nodes, body aches, joint swelling, muscle aches, chest pain, shortness of breath, mood changes.   Objective  Blood pressure 100/64, pulse 69, height 5\' 5"  (1.651 m), weight 150 lb (68 kg), SpO2 97 %.    General: Dawn [name] apparent distress alert and oriented x3 mood and affect normal, dressed appropriately.  HEENT: Pupils equal, extraocular movements intact  Respiratory: Patient's speak in full sentences and does not appear short of breath  Cardiovascular: Dawn [name] lower extremity edema, non tender, Dawn [name] erythema  Skin: Warm dry intact with Dawn [name] signs of infection or rash on extremities or on axial skeleton.  Abdomen: Soft nontender  Neuro: Cranial nerves II through XII are intact, neurovascularly intact in all extremities with 2+ DTRs and 2+ pulses.  Lymph: Dawn [name] lymphadenopathy of posterior or anterior cervical chain or axillae bilaterally.  Gait normal with good balance and coordination.  MSK:  Non tender with full range of motion and good stability and symmetric strength and tone of  elbows, wrist, hip, and ankles bilaterally.  Right shoulder exam is unremarkable with full range of motion and mild positive crossover test contralateral shoulder unremarkable  Knee: Bilateral Normal to inspection with Dawn [name] erythema or effusion or obvious bony abnormalities. Palpation normal with Dawn [name] warmth, joint line tenderness, patellar tenderness, or condyle tenderness. ROM full in flexion and extension and lower leg rotation. Ligaments with solid consistent endpoints including ACL, PCL, LCL, MCL. Negative Mcmurray's, Apley's, and Thessalonian tests. Mild painful patellar compression. Patellar glide without crepitus. Patellar and quadriceps tendons unremarkable. Hamstring and quadriceps strength is normal.  Back Exam:  Inspection:  Unremarkable  Motion: Flexion 45 deg, Extension 25 deg, Side Bending to 35 deg bilaterally,  Rotation to 35 deg bilaterally  SLR laying: Negative  XSLR laying: Negative  Palpable tenderness: Tender to palpation the paraspinal musculature bilaterally lower back. FABER: negative. Sensory change: Gross sensation intact to all lumbar and sacral dermatomes.  Reflexes: 2+ at both patellar tendons, 2+ at achilles tendons, Babinski's downgoing.  Strength at foot  Plantar-flexion: 5/5 Dorsi-flexion: 5/5 Eversion: 5/5 Inversion: 5/5  Leg strength  Quad: 5/5 Hamstring: 5/5 Hip flexor: 5/5 Hip abductors: 5/5  Gait unremarkable.    Impression and Recommendations:     This case required medical decision making of moderate complexity. The above documentation has been reviewed and is accurate and complete Dawn Pulley, DO       Note: This dictation was prepared with Dragon dictation along with smaller phrase technology. Any transcriptional errors that result from this process are unintentional.

## 2017-10-06 NOTE — Assessment & Plan Note (Signed)
Stable, responding well to conservative therapy.  Discussed icing regimen.  Follow-up again 2 months

## 2017-10-06 NOTE — Assessment & Plan Note (Signed)
Stable, given core exercises, discussed posture and ergonomics throughout the day.  Follow-up again in 4 weeks

## 2017-11-21 ENCOUNTER — Other Ambulatory Visit: Payer: Self-pay | Admitting: Family Medicine

## 2017-12-03 NOTE — Progress Notes (Signed)
[name] [name], [place] [address] Phone: [phone] Subjective:    I Dawn [name] am serving as a Education administrator for Dr. Hulan Saas.   CC: Right shoulder pain  [id number]:[id number]  Dawn [name] is a 47 y.o. female coming in with complaint of right shoulder pain. States the shoulder was doing well until a few weeks ago. Hasn't been doing as many exercises. Pain has reoccurred.  Patient states waking her up at night.  Certain range of motion can be difficult as well.  Still seems to be located on the top of the shoulder.  Worse with over the head activity and reaching across her chest.  Low back pain as well.  Has been doing exercise.  Discussed icing regimen and home exercises.       Past Medical History:  Diagnosis Date  . Allergy   . Frequent headaches   . GERD (gastroesophageal reflux disease)   . H/O sinus surgery    No past surgical history on file. Social History   Socioeconomic History  . Marital status: Married    Spouse name: Not on file  . Number of children: 3  . Years of education: Not on file  . Highest education level: Master's degree (e.g., MA, MS, MEng, MEd, MSW, MBA)  Occupational History  . Not on file  Social Needs  . Financial resource strain: Not on file  . Food insecurity:    Worry: Not on file    Inability: Not on file  . Transportation needs:    Medical: Not on file    Non-medical: Not on file  Tobacco Use  . Smoking status: Never Smoker  . Smokeless tobacco: Never Used  Substance and Sexual Activity  . Alcohol use: Yes  . Drug use: No  . Sexual activity: Yes    Birth control/protection: IUD  Lifestyle  . Physical activity:    Days per week: Not on file    Minutes per session: Not on file  . Stress: Not on file  Relationships  . Social connections:    Talks on phone: Not on file    Gets together: Not on file    Attends religious service: Not on file    Active member of club or  organization: Not on file    Attends meetings of clubs or organizations: Not on file    Relationship status: Not on file  Other Topics Concern  . Not on file  Social History Narrative  . Not on file   Allergies  Allergen Reactions  . Amoxicillin    Family History  Problem Relation Age of Onset  . Asthma Mother   . Hyperlipidemia Mother   . Cancer Maternal Grandmother   . Hypertension Maternal Grandmother   . Kidney disease Maternal Grandmother   . Cancer Maternal Grandfather   . Early death Paternal Grandmother   . Diabetes Paternal Grandfather   . Arthritis Paternal Grandfather   . Hypertension Paternal Grandfather     Current Outpatient Medications (Endocrine & Metabolic):  .  levonorgestrel (MIRENA, 52 MG,) 20 MCG/24HR IUD, Mirena 20 mcg/24 hr (5 years) intrauterine device  Take 1 device by intrauterine route.   Current Outpatient Medications (Respiratory):  .  loratadine (CLARITIN) 10 MG tablet, Take 1 tablet (10 mg total) by mouth daily.    Current Outpatient Medications (Other):  Marland Kitchen  Cholecalciferol (VITAMIN D3) 2000 units capsule, Take 1 capsule (2,000 Units total) by mouth daily. .  Diclofenac Sodium (  PENNSAID) 2 % SOLN, Place 2 g onto the skin 2 (two) times daily. .  pantoprazole (PROTONIX) 40 MG tablet, TAKE 1 TABLET BY MOUTH TWICE A DAY .  polyethylene glycol powder (GLYCOLAX/MIRALAX) powder, Take 17 g by mouth 2 (two) times daily as needed for moderate constipation. .  Vitamin D, Ergocalciferol, (DRISDOL) 50000 units CAPS capsule, TAKE 1 CAPSULE (50,000 UNITS TOTAL) BY MOUTH EVERY 7 (SEVEN) DAYS. .  hyoscyamine (LEVSIN, ANASPAZ) 0.125 MG tablet, Take 1-2 tablets (0.125-0.25 mg total) by mouth every 4 (four) hours as needed for up to 10 days for cramping.    Past medical history, social, surgical and family history all reviewed in electronic medical record.  No pertanent information unless stated regarding to the chief complaint.   Review of Systems:  No  headache, visual changes, nausea, vomiting, diarrhea, constipation, dizziness, abdominal pain, skin rash, fevers, chills, night sweats, weight loss, swollen lymph nodes, body aches, joint swelling,  chest pain, shortness of breath, mood changes.  Positive muscle aches  Objective  Blood pressure 100/60, pulse 72, height 5\' 5"  (1.651 m), weight 150 lb (68 kg), SpO2 98 %.   General: No apparent distress alert and oriented x3 mood and affect normal, dressed appropriately.  HEENT: Pupils equal, extraocular movements intact  Respiratory: Patient's speak in full sentences and does not appear short of breath  Cardiovascular: No lower extremity edema, non tender, no erythema  Skin: Warm dry intact with no signs of infection or rash on extremities or on axial skeleton.  Abdomen: Soft nontender  Neuro: Cranial nerves II through XII are intact, neurovascularly intact in all extremities with 2+ DTRs and 2+ pulses.  Lymph: No lymphadenopathy of posterior or anterior cervical chain or axillae bilaterally.  Gait normal with good balance and coordination.  MSK:  Non tender with full range of motion and good stability and symmetric strength and tone of  elbows, wrist, hip, knee and ankles bilaterally.  Shoulder: Inspection reveals no abnormalities, atrophy or asymmetry. Palpation is normal with no tenderness over AC joint or bicipital groove. ROM is full in all planes. Rotator cuff strength normal throughout. Mild positive impingement Speeds and Yergason's tests normal. Positive crossover. Normal scapular function observed. No painful arc and no drop arm sign. No apprehension sign Contralateral shoulder unremarkable  Back Exam:  Inspection: Mild loss of lordosis Motion: Flexion 45 deg, Extension 45 deg, Side Bending to 45 deg bilaterally,  Rotation to 45 deg bilaterally  SLR laying: Negative  XSLR laying: Negative  Palpable tenderness: Tender to palpation.  Diffusely mostly around the sacroiliac  joints bilaterally.Marland Kitchen FABER: Positive Faber. Sensory change: Gross sensation intact to all lumbar and sacral dermatomes.  Reflexes: 2+ at both patellar tendons, 2+ at achilles tendons, Babinski's downgoing.  Strength at foot  Plantar-flexion: 5/5 Dorsi-flexion: 5/5 Eversion: 5/5 Inversion: 5/5  Leg strength  Quad: 5/5 Hamstring: 5/5 Hip flexor: 5/5 Hip abductors: 5/5  Gait unremarkable.   Osteopathic findings C2 flexed rotated and side bent right T5 extended rotated and side bent right inhaled rib T9 extended rotated and side bent left L4 flexed rotated and side bent left  Sacrum right on right     Impression and Recommendations:     This case required medical decision making of moderate complexity. The above documentation has been reviewed and is accurate and complete [name], DO       Note: This dictation was prepared with Dragon dictation along with smaller phrase technology. Any transcriptional errors that result from this  process are unintentional.

## 2017-12-06 ENCOUNTER — Encounter: Payer: Self-pay | Admitting: Family Medicine

## 2017-12-06 ENCOUNTER — Ambulatory Visit: Payer: 59 | Admitting: Family Medicine

## 2017-12-06 VITALS — BP 100/60 | HR 72 | Ht 65.0 in | Wt 150.0 lb

## 2017-12-06 DIAGNOSIS — M25511 Pain in right shoulder: Secondary | ICD-10-CM | POA: Diagnosis not present

## 2017-12-06 DIAGNOSIS — M999 Biomechanical lesion, unspecified: Secondary | ICD-10-CM

## 2017-12-06 DIAGNOSIS — M545 Low back pain, unspecified: Secondary | ICD-10-CM

## 2017-12-06 NOTE — Assessment & Plan Note (Signed)
Multifactorial, some core weakness.  Discussed hip abductor strengthening, physical therapy will be beneficial.  Started manipulation.  Follow-up again in 4 to 6 weeks

## 2017-12-06 NOTE — Patient Instructions (Addendum)
Good to see you  Ice is your friend still  pennsaid pinkie amount topically 2 times daily as needed.  Keep hands within peripheral vision  Stay active  PT will call you  Make sure you eat within 30 minutes of working out  See me again in 4-5 weeks

## 2017-12-06 NOTE — Assessment & Plan Note (Signed)
Patient declined any type of injection, and sent to formal physical therapy.  Discussed icing regimen and proper home exercises.  Follow-up again in 4 to 6 weeks

## 2017-12-06 NOTE — Assessment & Plan Note (Signed)
Decision today to treat with OMT was based on Physical Exam  After verbal consent patient was treated with HVLA, ME, FPR techniques in cervical, thoracic, rib, lumbar and sacral areas  Patient tolerated the procedure well with improvement in symptoms  Patient given exercises, stretches and lifestyle modifications  See medications in patient instructions if given  Patient will follow up in 4-6 weeks 

## 2017-12-16 DIAGNOSIS — M545 Low back pain: Secondary | ICD-10-CM | POA: Diagnosis not present

## 2017-12-16 DIAGNOSIS — M25511 Pain in right shoulder: Secondary | ICD-10-CM | POA: Diagnosis not present

## 2017-12-16 DIAGNOSIS — M542 Cervicalgia: Secondary | ICD-10-CM | POA: Diagnosis not present

## 2017-12-24 DIAGNOSIS — M545 Low back pain: Secondary | ICD-10-CM | POA: Diagnosis not present

## 2017-12-24 DIAGNOSIS — M25511 Pain in right shoulder: Secondary | ICD-10-CM | POA: Diagnosis not present

## 2017-12-24 DIAGNOSIS — M542 Cervicalgia: Secondary | ICD-10-CM | POA: Diagnosis not present

## 2017-12-28 DIAGNOSIS — M542 Cervicalgia: Secondary | ICD-10-CM | POA: Diagnosis not present

## 2017-12-28 DIAGNOSIS — M545 Low back pain: Secondary | ICD-10-CM | POA: Diagnosis not present

## 2017-12-28 DIAGNOSIS — M25511 Pain in right shoulder: Secondary | ICD-10-CM | POA: Diagnosis not present

## 2018-01-04 NOTE — Progress Notes (Signed)
[name] [place], [place] [address] Phone: [phone] Subjective:    I Dawn [name] am serving as a Education administrator for Dr. Hulan Saas.   CC: Right shoulder pain, neck pain follow-up  [id number]  Dawn [name] is a 47 y.o. female coming in with complaint of right shoulder pain. Started PT. Dry needling is painful but relieves a lot of pain.  Patient denies any radiation of pain.  Has noticed some mild increase in low back pain though.  Patient describes as a dull, throbbing aching sensation.     Past Medical History:  Diagnosis Date  . Allergy   . Frequent headaches   . GERD (gastroesophageal reflux disease)   . H/O sinus surgery    No past surgical history on file. Social History   Socioeconomic History  . Marital status: Married    Spouse name: Not on file  . Number of children: 3  . Years of education: Not on file  . Highest education level: Master's degree (e.g., MA, MS, MEng, MEd, MSW, MBA)  Occupational History  . Not on file  Social Needs  . Financial resource strain: Not on file  . Food insecurity:    Worry: Not on file    Inability: Not on file  . Transportation needs:    Medical: Not on file    Non-medical: Not on file  Tobacco Use  . Smoking status: Never Smoker  . Smokeless tobacco: Never Used  Substance and Sexual Activity  . Alcohol use: Yes  . Drug use: No  . Sexual activity: Yes    Birth control/protection: IUD  Lifestyle  . Physical activity:    Days per week: Not on file    Minutes per session: Not on file  . Stress: Not on file  Relationships  . Social connections:    Talks on phone: Not on file    Gets together: Not on file    Attends religious service: Not on file    Active member of club or organization: Not on file    Attends meetings of clubs or organizations: Not on file    Relationship status: Not on file  Other Topics Concern  . Not on file  Social History Narrative  . Not  on file   Allergies  Allergen Reactions  . Amoxicillin    Family History  Problem Relation Age of Onset  . Asthma Mother   . Hyperlipidemia Mother   . Cancer Maternal Grandmother   . Hypertension Maternal Grandmother   . Kidney disease Maternal Grandmother   . Cancer Maternal Grandfather   . Early death Paternal Grandmother   . Diabetes Paternal Grandfather   . Arthritis Paternal Grandfather   . Hypertension Paternal Grandfather     Current Outpatient Medications (Endocrine & Metabolic):  .  levonorgestrel (MIRENA, 52 MG,) 20 MCG/24HR IUD, Mirena 20 mcg/24 hr (5 years) intrauterine device  Take 1 device by intrauterine route.   Current Outpatient Medications (Respiratory):  .  loratadine (CLARITIN) 10 MG tablet, Take 1 tablet (10 mg total) by mouth daily.    Current Outpatient Medications (Other):  Marland Kitchen  Cholecalciferol (VITAMIN D3) 2000 units capsule, Take 1 capsule (2,000 Units total) by mouth daily. .  Diclofenac Sodium (PENNSAID) 2 % SOLN, Place 2 g onto the skin 2 (two) times daily. .  pantoprazole (PROTONIX) 40 MG tablet, TAKE 1 TABLET BY MOUTH TWICE A DAY .  polyethylene glycol powder (GLYCOLAX/MIRALAX) powder, Take 17 g by  mouth 2 (two) times daily as needed for moderate constipation. .  Vitamin D, Ergocalciferol, (DRISDOL) 50000 units CAPS capsule, TAKE 1 CAPSULE (50,000 UNITS TOTAL) BY MOUTH EVERY 7 (SEVEN) DAYS. .  hyoscyamine (LEVSIN, ANASPAZ) 0.125 MG tablet, Take 1-2 tablets (0.125-0.25 mg total) by mouth every 4 (four) hours as needed for up to 10 days for cramping.    Past medical history, social, surgical and family history all reviewed in electronic medical record.  No pertanent information unless stated regarding to the chief complaint.   Review of Systems:  No , visual changes, nausea, vomiting, diarrhea, constipation, dizziness, abdominal pain, skin rash, fevers, chills, night sweats, weight loss, swollen lymph nodes, body aches, joint swelling, m chest  pain, shortness of breath, mood changes.  Positive muscle aches, headaches  Objective  Blood pressure 108/70, pulse 69, height 5\' 5"  (1.651 m), weight 152 lb (68.9 kg), SpO2 97 %.    General: No apparent distress alert and oriented x3 mood and affect normal, dressed appropriately.  HEENT: Pupils equal, extraocular movements intact  Respiratory: Patient's speak in full sentences and does not appear short of breath  Cardiovascular: No lower extremity edema, non tender, no erythema  Skin: Warm dry intact with no signs of infection or rash on extremities or on axial skeleton.  Abdomen: Soft nontender  Neuro: Cranial nerves II through XII are intact, neurovascularly intact in all extremities with 2+ DTRs and 2+ pulses.  Lymph: No lymphadenopathy of posterior or anterior cervical chain or axillae bilaterally.  Gait normal with good balance and coordination.  MSK:  Non tender with full range of motion and good stability and symmetric strength and tone of shoulders, elbows, wrist, hip, knee and ankles bilaterally.  Neck: Inspection mild loss of lordosis. No palpable stepoffs. Negative Spurling's maneuver. Full neck range of motion Grip strength and sensation normal in bilateral hands Strength good C4 to T1 distribution No sensory change to C4 to T1 Negative Hoffman sign bilaterally Reflexes normal Tightness of the trapezius bilaterally  Low back pain shows some tenderness to palpation of the sacroiliac joints bilaterally.  Osteopathic findings C2 flexed rotated and side bent right T3 extended rotated and side bent right inhaled third rib T5 extended rotated and side bent left L2 flexed rotated and side bent right Sacrum right on right    Impression and Recommendations:     This case required medical decision making of moderate complexity. The above documentation has been reviewed and is accurate and complete [name], DO       Note: This dictation was prepared with  Dragon dictation along with smaller phrase technology. Any transcriptional errors that result from this process are unintentional.

## 2018-01-05 ENCOUNTER — Encounter: Payer: Self-pay | Admitting: Family Medicine

## 2018-01-05 ENCOUNTER — Ambulatory Visit: Payer: 59 | Admitting: Family Medicine

## 2018-01-05 VITALS — BP 108/70 | HR 69 | Ht 65.0 in | Wt 152.0 lb

## 2018-01-05 DIAGNOSIS — M9903 Segmental and somatic dysfunction of lumbar region: Secondary | ICD-10-CM

## 2018-01-05 DIAGNOSIS — M999 Biomechanical lesion, unspecified: Secondary | ICD-10-CM

## 2018-01-05 DIAGNOSIS — G8929 Other chronic pain: Secondary | ICD-10-CM | POA: Diagnosis not present

## 2018-01-05 DIAGNOSIS — M9908 Segmental and somatic dysfunction of rib cage: Secondary | ICD-10-CM

## 2018-01-05 DIAGNOSIS — M9901 Segmental and somatic dysfunction of cervical region: Secondary | ICD-10-CM | POA: Diagnosis not present

## 2018-01-05 DIAGNOSIS — M545 Low back pain: Secondary | ICD-10-CM

## 2018-01-05 DIAGNOSIS — M9904 Segmental and somatic dysfunction of sacral region: Secondary | ICD-10-CM | POA: Diagnosis not present

## 2018-01-05 DIAGNOSIS — M9902 Segmental and somatic dysfunction of thoracic region: Secondary | ICD-10-CM | POA: Diagnosis not present

## 2018-01-05 NOTE — Patient Instructions (Addendum)
Good to see you  Happy holidays!  COntinue the PT as much as you think it helps Try to change up the elliptical  See me agai nin 5-6 weeks

## 2018-01-05 NOTE — Assessment & Plan Note (Signed)
Decision today to treat with OMT was based on Physical Exam  After verbal consent patient was treated with HVLA, ME, FPR techniques in cervical, thoracic, riub lumbar and sacral areas  Patient tolerated the procedure well with improvement in symptoms  Patient given exercises, stretches and lifestyle modifications  See medications in patient instructions if given  Patient will follow up in 4-6 weeks

## 2018-01-05 NOTE — Assessment & Plan Note (Signed)
.    Discussed icing regimen and home exercise.  Discussed which activities to do which wants to avoid.  Patient is to increase activity as tolerated.  Follow-up with me again 4 to 6 weeks.  Continue physical therapy if it is beneficial

## 2018-01-06 DIAGNOSIS — M25511 Pain in right shoulder: Secondary | ICD-10-CM | POA: Diagnosis not present

## 2018-01-06 DIAGNOSIS — M545 Low back pain: Secondary | ICD-10-CM | POA: Diagnosis not present

## 2018-01-06 DIAGNOSIS — M542 Cervicalgia: Secondary | ICD-10-CM | POA: Diagnosis not present

## 2018-01-13 DIAGNOSIS — D3617 Benign neoplasm of peripheral nerves and autonomic nervous system of trunk, unspecified: Secondary | ICD-10-CM | POA: Diagnosis not present

## 2018-01-13 DIAGNOSIS — D225 Melanocytic nevi of trunk: Secondary | ICD-10-CM | POA: Diagnosis not present

## 2018-01-13 DIAGNOSIS — D1801 Hemangioma of skin and subcutaneous tissue: Secondary | ICD-10-CM | POA: Diagnosis not present

## 2018-02-14 ENCOUNTER — Other Ambulatory Visit: Payer: Self-pay | Admitting: Family Medicine

## 2018-02-16 ENCOUNTER — Ambulatory Visit: Payer: 59 | Admitting: Family Medicine

## 2018-02-16 ENCOUNTER — Encounter: Payer: Self-pay | Admitting: Family Medicine

## 2018-02-16 VITALS — BP 114/72 | HR 68 | Ht 65.0 in | Wt 152.0 lb

## 2018-02-16 DIAGNOSIS — M545 Low back pain, unspecified: Secondary | ICD-10-CM

## 2018-02-16 DIAGNOSIS — G8929 Other chronic pain: Secondary | ICD-10-CM | POA: Diagnosis not present

## 2018-02-16 DIAGNOSIS — M7711 Lateral epicondylitis, right elbow: Secondary | ICD-10-CM | POA: Diagnosis not present

## 2018-02-16 DIAGNOSIS — M999 Biomechanical lesion, unspecified: Secondary | ICD-10-CM

## 2018-02-16 NOTE — Assessment & Plan Note (Signed)
Decision today to treat with OMT was based on Physical Exam  After verbal consent patient was treated with HVLA, ME, FPR techniques in cervical, thoracic, rib lumbar and sacral areas  Patient tolerated the procedure well with improvement in symptoms  Patient given exercises, stretches and lifestyle modifications  See medications in patient instructions if given  Patient will follow up in 6 weeks 

## 2018-02-16 NOTE — Assessment & Plan Note (Signed)
Lateral Epicondylitis: Elbow anatomy was reviewed, and tendinopathy was explained.  Pt. given a formal rehab program. Series of concentric and eccentric exercises should be done starting with no weight, work up to 1 lb, hammer, etc.  Use counterforce strap if working or using hands.  Formal PT would be beneficial. Emphasized stretching an cross-friction massage Emphasized proper palms up lifting biomechanics to unload ECRB Wrist brace given today.  Work with a Clinical research associate to learn home exercises in greater detail.  Follow-up again in 4 to 6 weeks

## 2018-02-16 NOTE — Patient Instructions (Signed)
Good to see you  Ice is your friend Avoid any overhand lifting for now Wear wrist brace day and night for 1 week then nightly for 2 weeks pennsaid pinkie amount topically 2 times daily as needed.  Your neck and back are doing better overall  See me again in 5-6 weeks

## 2018-02-16 NOTE — Assessment & Plan Note (Signed)
Multifactorial.  Patient still has some room to go with core strengthening.  Discussed the isometrics, core strengthening, home exercises as well as weight loss.  Patient does respond well to manipulation though.  Follow-up again in 6 weeks

## 2018-02-16 NOTE — Progress Notes (Signed)
[name] [address] [place], [place] [address] Phone: [phone] Subjective:   [name], am serving as a scribe for Dr. Hulan Saas.  CC: Back pain follow-up  [id number]  Kem Helmers is a 48 y.o. female coming in with complaint of back pain. She said that she is doing much better. Did have a cold 3 weeks ago which caused an increase in her lower back. Pain has improved. Patient also notes right shoulder pain in which she believes caused right elbow pain distal to the joint. Patient feels that her pain is muscular in nature. Having hard time picking up purse without pain.      Past Medical History:  Diagnosis Date  . Allergy   . Frequent headaches   . GERD (gastroesophageal reflux disease)   . H/O sinus surgery    No past surgical history on file. Social History   Socioeconomic History  . Marital status: Married    Spouse name: Not on file  . Number of children: 3  . Years of education: Not on file  . Highest education level: Master's degree (e.g., MA, MS, MEng, MEd, MSW, MBA)  Occupational History  . Not on file  Social Needs  . Financial resource strain: Not on file  . Food insecurity:    Worry: Not on file    Inability: Not on file  . Transportation needs:    Medical: Not on file    Non-medical: Not on file  Tobacco Use  . Smoking status: Never Smoker  . Smokeless tobacco: Never Used  Substance and Sexual Activity  . Alcohol use: Yes  . Drug use: No  . Sexual activity: Yes    Birth control/protection: I.U.D.  Lifestyle  . Physical activity:    Days per week: Not on file    Minutes per session: Not on file  . Stress: Not on file  Relationships  . Social connections:    Talks on phone: Not on file    Gets together: Not on file    Attends religious service: Not on file    Active member of club or organization: Not on file    Attends meetings of clubs or organizations: Not on file    Relationship status:  Not on file  Other Topics Concern  . Not on file  Social History Narrative  . Not on file   Allergies  Allergen Reactions  . Amoxicillin    Family History  Problem Relation Age of Onset  . Asthma Mother   . Hyperlipidemia Mother   . Cancer Maternal Grandmother   . Hypertension Maternal Grandmother   . Kidney disease Maternal Grandmother   . Cancer Maternal Grandfather   . Early death Paternal Grandmother   . Diabetes Paternal Grandfather   . Arthritis Paternal Grandfather   . Hypertension Paternal Grandfather     Current Outpatient Medications (Endocrine & Metabolic):  .  levonorgestrel (MIRENA, 52 MG,) 20 MCG/24HR IUD, Mirena 20 mcg/24 hr (5 years) intrauterine device  Take 1 device by intrauterine route.   Current Outpatient Medications (Respiratory):  .  loratadine (CLARITIN) 10 MG tablet, Take 1 tablet (10 mg total) by mouth daily.    Current Outpatient Medications (Other):  Marland Kitchen  Cholecalciferol (VITAMIN D3) 2000 units capsule, Take 1 capsule (2,000 Units total) by mouth daily. .  Diclofenac Sodium (PENNSAID) 2 % SOLN, Place 2 g onto the skin 2 (two) times daily. .  pantoprazole (PROTONIX) 40 MG tablet, TAKE 1  TABLET BY MOUTH TWICE A DAY .  polyethylene glycol powder (GLYCOLAX/MIRALAX) powder, Take 17 g by mouth 2 (two) times daily as needed for moderate constipation. .  Vitamin D, Ergocalciferol, (DRISDOL) 1.25 MG (50000 UT) CAPS capsule, TAKE 1 CAPSULE (50,000 UNITS TOTAL) BY MOUTH EVERY 7 (SEVEN) DAYS. .  hyoscyamine (LEVSIN, ANASPAZ) 0.125 MG tablet, Take 1-2 tablets (0.125-0.25 mg total) by mouth every 4 (four) hours as needed for up to 10 days for cramping.    Past medical history, social, surgical and family history all reviewed in electronic medical record.  No pertanent information unless stated regarding to the chief complaint.   Review of Systems:  No headache, visual changes, nausea, vomiting, diarrhea, constipation, dizziness, abdominal pain, skin rash,  fevers, chills, night sweats, weight loss, swollen lymph nodes, body aches, joint swelling,chest pain, shortness of breath, mood changes.  Positive muscle aches  Objective  Blood pressure 114/72, pulse 68, height 5\' 5"  (1.651 m), weight 152 lb (68.9 kg), SpO2 98 %.    General: No apparent distress alert and oriented x3 mood and affect normal, dressed appropriately.  HEENT: Pupils equal, extraocular movements intact  Respiratory: Patient's speak in full sentences and does not appear short of breath  Cardiovascular: No lower extremity edema, non tender, no erythema  Skin: Warm dry intact with no signs of infection or rash on extremities or on axial skeleton.  Abdomen: Soft nontender  Neuro: Cranial nerves II through XII are intact, neurovascularly intact in all extremities with 2+ DTRs and 2+ pulses.  Lymph: No lymphadenopathy of posterior or anterior cervical chain or axillae bilaterally.  Gait normal with good balance and coordination.  MSK:  Non tender with full range of motion and good stability and symmetric strength and tone of shoulders, , wrist, hip, knee and ankles bilaterally.  Patient's right elbow has full range of motion.  Tenderness though with resisted wrist extension over the lateral epicondylar region.  No significant instability.  Full supination and pronation.  Good strength noted. Back Exam:  Inspection: Unremarkable  Motion: Flexion 40 deg, Extension 25 deg, Side Bending to 35 deg bilaterally, Rotation to 35 deg bilaterally  SLR laying: Negative  XSLR laying: Negative  Palpable tenderness: Mild tenderness to palpation in the thoracolumbar lumbar junction at the sacrum as well.  Right greater than left. FABER: negative. Sensory change: Gross sensation intact to all lumbar and sacral dermatomes.  Reflexes: 2+ at both patellar tendons, 2+ at achilles tendons, Babinski's downgoing.  Strength at foot  Plantar-flexion: 5/5 Dorsi-flexion: 5/5 Eversion: 5/5 Inversion: 5/5  Leg  strength  Quad: 5/5 Hamstring: 5/5 Hip flexor: 5/5 Hip abductors: 5/5  Gait unremarkable.  Osteopathic findings C2 flexed rotated and side bent right C6 flexed rotated and side bent left T6 extended rotated and side bent right inhaled rib T9 extended rotated and side bent left L2 flexed rotated and side bent right Sacrum right on right    Impression and Recommendations:     This case required medical decision making of moderate complexity. The above documentation has been reviewed and is accurate and complete [name], DO       Note: This dictation was prepared with Dragon dictation along with smaller phrase technology. Any transcriptional errors that result from this process are unintentional.

## 2018-03-28 NOTE — Progress Notes (Signed)
[name] [name], [name] [address] Phone: [phone] Subjective:   Dawn [name], am serving as a scribe for Dr. Hulan Saas.  CC: Back pain and neck pain follow-up  [id number]  Dawn [name] is a 48 y.o. female coming in with complaint of right elbow and back pain. She said that she is sore in the left elbow but that is it improving. Is able to lift more than before. Does not feel like she is going to drop items. Patient states overall is doing much better.  Patient has avoided lifting with wrist extension as much as she can and has noticed that about 60% better improvement in the elbow area.    Past Medical History:  Diagnosis Date  . Allergy   . Frequent headaches   . GERD (gastroesophageal reflux disease)   . H/O sinus surgery    [name] past surgical history on file. Social History   Socioeconomic History  . Marital status: Married    Spouse name: Not on file  . Number of children: 3  . Years of education: Not on file  . Highest education level: Master's degree (e.g., MA, MS, MEng, MEd, MSW, MBA)  Occupational History  . Not on file  Social Needs  . Financial resource strain: Not on file  . Food insecurity:    Worry: Not on file    Inability: Not on file  . Transportation needs:    Medical: Not on file    Non-medical: Not on file  Tobacco Use  . Smoking status: Never Smoker  . Smokeless tobacco: Never Used  Substance and Sexual Activity  . Alcohol use: Yes  . Drug use: [name]  . Sexual activity: Yes    Birth control/protection: I.U.D.  Lifestyle  . Physical activity:    Days per week: Not on file    Minutes per session: Not on file  . Stress: Not on file  Relationships  . Social connections:    Talks on phone: Not on file    Gets together: Not on file    Attends religious service: Not on file    Active member of club or organization: Not on file    Attends meetings of clubs or organizations: Not on file     Relationship status: Not on file  Other Topics Concern  . Not on file  Social History Narrative  . Not on file   Allergies  Allergen Reactions  . Amoxicillin    Family History  Problem Relation Age of Onset  . Asthma Mother   . Hyperlipidemia Mother   . Cancer Maternal Grandmother   . Hypertension Maternal Grandmother   . Kidney disease Maternal Grandmother   . Cancer Maternal Grandfather   . Early death Paternal Grandmother   . Diabetes Paternal Grandfather   . Arthritis Paternal Grandfather   . Hypertension Paternal Grandfather     Current Outpatient Medications (Endocrine & Metabolic):  .  levonorgestrel (MIRENA, 52 MG,) 20 MCG/24HR IUD, Mirena 20 mcg/24 hr (5 years) intrauterine device  Take 1 device by intrauterine route.   Current Outpatient Medications (Respiratory):  .  loratadine (CLARITIN) 10 MG tablet, Take 1 tablet (10 mg total) by mouth daily.    Current Outpatient Medications (Other):  Marland Kitchen  Cholecalciferol (VITAMIN D3) 2000 units capsule, Take 1 capsule (2,000 Units total) by mouth daily. .  Diclofenac Sodium (PENNSAID) 2 % SOLN, Place 2 g onto the skin 2 (two) times daily. Marland Kitchen  hyoscyamine (LEVSIN, ANASPAZ) 0.125 MG tablet, Take 1-2 tablets (0.125-0.25 mg total) by mouth every 4 (four) hours as needed for up to 10 days for cramping. .  pantoprazole (PROTONIX) 40 MG tablet, TAKE 1 TABLET BY MOUTH TWICE A DAY .  polyethylene glycol powder (GLYCOLAX/MIRALAX) powder, Take 17 g by mouth 2 (two) times daily as needed for moderate constipation. .  Vitamin D, Ergocalciferol, (DRISDOL) 1.25 MG (50000 UT) CAPS capsule, TAKE 1 CAPSULE (50,000 UNITS TOTAL) BY MOUTH EVERY 7 (SEVEN) DAYS.    Past medical history, social, surgical and family history all reviewed in electronic medical record.  [name] pertanent information unless stated regarding to the chief complaint.   Review of Systems:  [name] headache, visual changes, nausea, vomiting, diarrhea, constipation, dizziness,  abdominal pain, skin rash, fevers, chills, night sweats, weight loss, swollen lymph nodes, body aches, joint swelling, muscle aches, chest pain, shortness of breath, mood changes.   Objective  There were [name] vitals taken for this visit. Systems examined below as of    General: [name] apparent distress alert and oriented x3 mood and affect normal, dressed appropriately.  HEENT: Pupils equal, extraocular movements intact  Respiratory: Patient's speak in full sentences and does not appear short of breath  Cardiovascular: [name] lower extremity edema, non tender, [name] erythema  Skin: Warm dry intact with [name] signs of infection or rash on extremities or on axial skeleton.  Abdomen: Soft nontender  Neuro: Cranial nerves II through XII are intact, neurovascularly intact in all extremities with 2+ DTRs and 2+ pulses.  Lymph: [name] lymphadenopathy of posterior or anterior cervical chain or axillae bilaterally.  Gait normal with good balance and coordination.  MSK:  Non tender with full range of motion and good stability and symmetric strength and tone of shoulders, elbows, wrist, hip, knee and ankles bilaterally.  Back Exam:  Inspection: Mild loss of lordosis Motion: Flexion 45 deg, Extension 25 deg, Side Bending to 35 deg bilaterally,  Rotation to 45 deg bilaterally  SLR laying: Negative  XSLR laying: Negative  Palpable tenderness: During the thoracolumbar and lumbosacral junctions bilaterally. FABER: negative. Sensory change: Gross sensation intact to all lumbar and sacral dermatomes.  Reflexes: 2+ at both patellar tendons, 2+ at achilles tendons, Babinski's downgoing.  Strength at foot  Plantar-flexion: 5/5 Dorsi-flexion: 5/5 Eversion: 5/5 Inversion: 5/5  Leg strength  Quad: 5/5 Hamstring: 5/5 Hip flexor: 5/5 Hip abductors: 5/5  Gait unremarkable.   Osteopathic findings C2 flexed rotated and side bent right C6 flexed rotated and side bent left T3 extended rotated and side bent right inhaled third  rib T9 extended rotated and side bent left L3 flexed rotated and side bent right Sacrum right on right    Impression and Recommendations:     This case required medical decision making of moderate complexity. The above documentation has been reviewed and is accurate and complete [name], DO       Note: This dictation was prepared with Dragon dictation along with smaller phrase technology. Any transcriptional errors that result from this process are unintentional.

## 2018-03-29 ENCOUNTER — Ambulatory Visit: Payer: 59 | Admitting: Family Medicine

## 2018-03-29 ENCOUNTER — Encounter: Payer: Self-pay | Admitting: Family Medicine

## 2018-03-29 VITALS — BP 102/68 | HR 80 | Ht 65.0 in | Wt 152.0 lb

## 2018-03-29 DIAGNOSIS — M545 Low back pain: Secondary | ICD-10-CM | POA: Diagnosis not present

## 2018-03-29 DIAGNOSIS — M999 Biomechanical lesion, unspecified: Secondary | ICD-10-CM

## 2018-03-29 DIAGNOSIS — G8929 Other chronic pain: Secondary | ICD-10-CM | POA: Diagnosis not present

## 2018-03-29 NOTE — Assessment & Plan Note (Signed)
Decision today to treat with OMT was based on Physical Exam  After verbal consent patient was treated with HVLA, ME, FPR techniques in cervical, thoracic, rib lumbar and sacral areas  Patient tolerated the procedure well with improvement in symptoms  Patient given exercises, stretches and lifestyle modifications  See medications in patient instructions if given  Patient will follow up in 4-8 weeks 

## 2018-03-29 NOTE — Assessment & Plan Note (Signed)
Low back pain, multifactorial.  Responding well to osteopathic.  Discussed posture and ergonomics.  Discussed core strengthening.  Continue to be active.  Follow-up again in 4 to 8 weeks

## 2018-03-29 NOTE — Patient Instructions (Signed)
Good to see you  Ice is your friend Stay active Keep listening to your son on how to pick things up  See me again in 6-7 weeks

## 2018-05-16 ENCOUNTER — Ambulatory Visit: Payer: 59 | Admitting: Family Medicine

## 2018-06-19 NOTE — Progress Notes (Signed)
[name] [address] [place], [place] [address] Phone: [phone] Subjective:   [name], am serving as a scribe for Dr. Hulan [name].  I'm seeing this patient by the request  of:    CC: Right elbow pain, back and neck pain follow-up  [id number]  Dawn [name] is a 48 y.o. female coming in with complaint of right elbow pain over lateral epicondyle. Pain with elbow extension and with weighted activities. Does have pain with tennis when she hits the ball improperly. Has not been using NSAIDs.   Her back pain has increased due to sleeping on the couch for the past 2 nights and increasing her activity from being sedentary during [place].  Patient has been doing some mild increase in gardening.  Feels like that is contributing to some ongoing back pain.  Denies any radiation down the legs.  Some tightness in the neck.  Thinks it is stress related.      Past Medical History:  Diagnosis Date  . Allergy   . Frequent headaches   . GERD (gastroesophageal reflux disease)   . H/O sinus surgery    No past surgical history on file. Social History   Socioeconomic History  . Marital status: Married    Spouse name: Not on file  . Number of children: 3  . Years of education: Not on file  . Highest education level: Master's degree (e.g., MA, MS, MEng, MEd, MSW, MBA)  Occupational History  . Not on file  Social Needs  . Financial resource strain: Not on file  . Food insecurity:    Worry: Not on file    Inability: Not on file  . Transportation needs:    Medical: Not on file    Non-medical: Not on file  Tobacco Use  . Smoking status: Never Smoker  . Smokeless tobacco: Never Used  Substance and Sexual Activity  . Alcohol use: Yes  . Drug use: No  . Sexual activity: Yes    Birth control/protection: I.U.D.  Lifestyle  . Physical activity:    Days per week: Not on file    Minutes per session: Not on file  . Stress: Not on file   Relationships  . Social connections:    Talks on phone: Not on file    Gets together: Not on file    Attends religious service: Not on file    Active member of club or organization: Not on file    Attends meetings of clubs or organizations: Not on file    Relationship status: Not on file  Other Topics Concern  . Not on file  Social History Narrative  . Not on file   Allergies  Allergen Reactions  . Amoxicillin    Family History  Problem Relation Age of Onset  . Asthma Mother   . Hyperlipidemia Mother   . Cancer Maternal Grandmother   . Hypertension Maternal Grandmother   . Kidney disease Maternal Grandmother   . Cancer Maternal Grandfather   . Early death Paternal Grandmother   . Diabetes Paternal Grandfather   . Arthritis Paternal Grandfather   . Hypertension Paternal Grandfather     Current Outpatient Medications (Endocrine & Metabolic):  .  levonorgestrel (MIRENA, 52 MG,) 20 MCG/24HR IUD, Mirena 20 mcg/24 hr (5 years) intrauterine device  Take 1 device by intrauterine route.   Current Outpatient Medications (Respiratory):  .  loratadine (CLARITIN) 10 MG tablet, Take 1 tablet (10 mg total) by mouth daily.  Current Outpatient Medications (Other):  Marland Kitchen  Cholecalciferol (VITAMIN D3) 2000 units capsule, Take 1 capsule (2,000 Units total) by mouth daily. .  Diclofenac Sodium (PENNSAID) 2 % SOLN, Place 2 g onto the skin 2 (two) times daily. .  hyoscyamine (LEVSIN, ANASPAZ) 0.125 MG tablet, Take 1-2 tablets (0.125-0.25 mg total) by mouth every 4 (four) hours as needed for up to 10 days for cramping. .  pantoprazole (PROTONIX) 40 MG tablet, TAKE 1 TABLET BY MOUTH TWICE A DAY .  polyethylene glycol powder (GLYCOLAX/MIRALAX) powder, Take 17 g by mouth 2 (two) times daily as needed for moderate constipation.    Past medical history, social, surgical and family history all reviewed in electronic medical record.  No pertanent information unless stated regarding to the chief  complaint.   Review of Systems:  No headache, visual changes, nausea, vomiting, diarrhea, constipation, dizziness, abdominal pain, skin rash, fevers, chills, night sweats, weight loss, swollen lymph nodes, body aches, joint swelling, muscle aches, chest pain, shortness of breath, mood changes.   Objective  There were no vitals taken for this visit. Systems examined below as of    General: No apparent distress alert and oriented x3 mood and affect normal, dressed appropriately.  HEENT: Pupils equal, extraocular movements intact  Respiratory: Patient's speak in full sentences and does not appear short of breath  Cardiovascular: No lower extremity edema, non tender, no erythema  Skin: Warm dry intact with no signs of infection or rash on extremities or on axial skeleton.  Abdomen: Soft nontender  Neuro: Cranial nerves II through XII are intact, neurovascularly intact in all extremities with 2+ DTRs and 2+ pulses.  Lymph: No lymphadenopathy of posterior or anterior cervical chain or axillae bilaterally.  Gait normal with good balance and coordination.  MSK:  Non tender with full range of motion and good stability and symmetric strength and tone of shoulders, elbows, wrist, hip, knee and ankles bilaterally.  Right elbow exam shows the patient is tender to palpation over the lateral epicondylar region.  Patient does have some pain with resisted wrist extension.  Good grip strength noted.  Full range of motion of the elbow noted  Some loss of lordosis.  Tender to palpation diffusely.  Some more pain in the parascapular region bilaterally.  More pain over the right sacroiliac joint but otherwise lower back exam is fairly unremarkable.   Limited musculoskeletal ultrasound was performed and interpreted by [name]  Location Normal Shoulder Tenderness and Lateral Epicondylar Hypoechoic Changes around the Extensor tendon.  Mild increase in Doppler flow.  No true tear though appreciated.    Osteopathic findings  C6 flexed rotated and side bent left T3 extended rotated and side bent right inhaled third rib T9 extended rotated and side bent left L2 flexed rotated and side bent right Sacrum right on right    Impression and Recommendations:     This case required medical decision making of moderate complexity. The above documentation has been reviewed and is accurate and complete [name], DO       Note: This dictation was prepared with Dragon dictation along with smaller phrase technology. Any transcriptional errors that result from this process are unintentional.

## 2018-06-20 ENCOUNTER — Ambulatory Visit: Payer: Self-pay

## 2018-06-20 ENCOUNTER — Ambulatory Visit: Payer: 59 | Admitting: Family Medicine

## 2018-06-20 ENCOUNTER — Other Ambulatory Visit: Payer: Self-pay

## 2018-06-20 VITALS — HR 81 | Ht 65.0 in | Wt 156.0 lb

## 2018-06-20 DIAGNOSIS — M545 Low back pain, unspecified: Secondary | ICD-10-CM

## 2018-06-20 DIAGNOSIS — M25521 Pain in right elbow: Secondary | ICD-10-CM

## 2018-06-20 DIAGNOSIS — G8929 Other chronic pain: Secondary | ICD-10-CM | POA: Diagnosis not present

## 2018-06-20 DIAGNOSIS — M999 Biomechanical lesion, unspecified: Secondary | ICD-10-CM

## 2018-06-20 DIAGNOSIS — M7711 Lateral epicondylitis, right elbow: Secondary | ICD-10-CM

## 2018-06-20 NOTE — Assessment & Plan Note (Signed)
Pain is multifactorial.  Responded well to manipulation.  Discussed which activities of doing which wants to avoid.  We discussed changing working environment at home if possible.  Follow-up again in 4 to 6 weeks

## 2018-06-20 NOTE — Patient Instructions (Signed)
Good to see you  Ice 20 minutes 2 times daily. Usually after activity and before bed. No lifting overhand String dampener on the racket  Wear wrist brace day and night for a week and nightly for 2 weeks See me again in 4 weeks

## 2018-06-20 NOTE — Assessment & Plan Note (Signed)
Decision today to treat with OMT was based on Physical Exam  After verbal consent patient was treated with HVLA, ME, FPR techniques in cervical, thoracic, rib lumbar and sacral areas  Patient tolerated the procedure well with improvement in symptoms  Patient given exercises, stretches and lifestyle modifications  See medications in patient instructions if given  Patient will follow up in 4-6 weeks 

## 2018-06-20 NOTE — Assessment & Plan Note (Signed)
Patient was making progress previously.  Encourage patient to go back to the brace.  We discussed the possibility of PRP.  Patient is to increase his activity.  We discussed which activities of doing which wants to avoid.  Patient avoid resisted wrist extension.  Follow-up again in 4 to 6 weeks otherwise.

## 2018-07-18 ENCOUNTER — Other Ambulatory Visit: Payer: Self-pay

## 2018-07-18 ENCOUNTER — Encounter: Payer: Self-pay | Admitting: Family Medicine

## 2018-07-18 ENCOUNTER — Ambulatory Visit: Payer: 59 | Admitting: Family Medicine

## 2018-07-18 VITALS — BP 106/78 | HR 72 | Ht 65.0 in | Wt 155.0 lb

## 2018-07-18 DIAGNOSIS — M999 Biomechanical lesion, unspecified: Secondary | ICD-10-CM

## 2018-07-18 DIAGNOSIS — M545 Low back pain, unspecified: Secondary | ICD-10-CM

## 2018-07-18 DIAGNOSIS — G8929 Other chronic pain: Secondary | ICD-10-CM | POA: Diagnosis not present

## 2018-07-18 NOTE — Patient Instructions (Signed)
Keep working on hip abductor strength   See me in 6 weeks

## 2018-07-18 NOTE — Progress Notes (Signed)
[name] [address], [place] [address] Phone: [phone] Subjective:   [name], am serving as a scribe for Dr. Hulan Saas.  CC: Elbow pain and back pain follow-up  [id number]  Dawn [name] Payment is a 48 y.o. female coming in with complaint of back and right elbow pain f/u. Does still have pain with weighted elbow extension. Pain has improved and she feels like she is 80%.   Back and shoulder pain has resolved. Did do a series of acupuncture which helped alleviate her pain.        Past Medical History:  Diagnosis Date  . Allergy   . Frequent headaches   . GERD (gastroesophageal reflux disease)   . H/O sinus surgery    No past surgical history on file. Social History   Socioeconomic History  . Marital status: Married    Spouse name: Not on file  . Number of children: 3  . Years of education: Not on file  . Highest education level: Master's degree (e.g., MA, MS, MEng, MEd, MSW, MBA)  Occupational History  . Not on file  Social Needs  . Financial resource strain: Not on file  . Food insecurity    Worry: Not on file    Inability: Not on file  . Transportation needs    Medical: Not on file    Non-medical: Not on file  Tobacco Use  . Smoking status: Never Smoker  . Smokeless tobacco: Never Used  Substance and Sexual Activity  . Alcohol use: Yes  . Drug use: No  . Sexual activity: Yes    Birth control/protection: I.U.D.  Lifestyle  . Physical activity    Days per week: Not on file    Minutes per session: Not on file  . Stress: Not on file  Relationships  . Social Herbalist on phone: Not on file    Gets together: Not on file    Attends religious service: Not on file    Active member of club or organization: Not on file    Attends meetings of clubs or organizations: Not on file    Relationship status: Not on file  Other Topics Concern  . Not on file  Social History Narrative  . Not on file    Allergies  Allergen Reactions  . Amoxicillin    Family History  Problem Relation Age of Onset  . Asthma Mother   . Hyperlipidemia Mother   . Cancer Maternal Grandmother   . Hypertension Maternal Grandmother   . Kidney disease Maternal Grandmother   . Cancer Maternal Grandfather   . Early death Paternal Grandmother   . Diabetes Paternal Grandfather   . Arthritis Paternal Grandfather   . Hypertension Paternal Grandfather     Current Outpatient Medications (Endocrine & Metabolic):  .  levonorgestrel (MIRENA, 52 MG,) 20 MCG/24HR IUD, Mirena 20 mcg/24 hr (5 years) intrauterine device  Take 1 device by intrauterine route.   Current Outpatient Medications (Respiratory):  .  loratadine (CLARITIN) 10 MG tablet, Take 1 tablet (10 mg total) by mouth daily.    Current Outpatient Medications (Other):  Marland Kitchen  Cholecalciferol (VITAMIN D3) 2000 units capsule, Take 1 capsule (2,000 Units total) by mouth daily. .  Diclofenac Sodium (PENNSAID) 2 % SOLN, Place 2 g onto the skin 2 (two) times daily. .  hyoscyamine (LEVSIN, ANASPAZ) 0.125 MG tablet, Take 1-2 tablets (0.125-0.25 mg total) by mouth every 4 (four) hours as needed for up  to 10 days for cramping. .  pantoprazole (PROTONIX) 40 MG tablet, TAKE 1 TABLET BY MOUTH TWICE A DAY .  polyethylene glycol powder (GLYCOLAX/MIRALAX) powder, Take 17 g by mouth 2 (two) times daily as needed for moderate constipation.    Past medical history, social, surgical and family history all reviewed in electronic medical record.  No pertanent information unless stated regarding to the chief complaint.   Review of Systems:  No headache, visual changes, nausea, vomiting, diarrhea, constipation, dizziness, abdominal pain, skin rash, fevers, chills, night sweats, weight loss, swollen lymph nodes, body aches, joint swelling, muscle aches, chest pain, shortness of breath, mood changes.   Objective  Blood pressure 106/78, pulse 72, height 5\' 5"  (1.651 m), weight 155  lb (70.3 kg), SpO2 99 %.    General: No apparent distress alert and oriented x3 mood and affect normal, dressed appropriately.  HEENT: Pupils equal, extraocular movements intact  Respiratory: Patient's speak in full sentences and does not appear short of breath  Cardiovascular: No lower extremity edema, non tender, no erythema  Skin: Warm dry intact with no signs of infection or rash on extremities or on axial skeleton.  Abdomen: Soft nontender  Neuro: Cranial nerves II through XII are intact, neurovascularly intact in all extremities with 2+ DTRs and 2+ pulses.  Lymph: No lymphadenopathy of posterior or anterior cervical chain or axillae bilaterally.  Gait normal with good balance and coordination.  MSK:  Non tender with full range of motion and good stability and symmetric strength and tone of shoulders, wrist, hip, knee and ankles bilaterally.  Elbow: Right Unremarkable to inspection. Range of motion full pronation, supination, flexion, extension. Strength is full to all of the above directions Stable to varus, valgus stress. Negative moving valgus stress test. No discrete areas of tenderness to palpation. Ulnar nerve does not sublux. Negative cubital tunnel Tinel's.  Back Exam:  Inspection: Mild loss of lordosis Motion: Flexion 45 deg, Extension 25 deg, Side Bending to 45 deg bilaterally,  Rotation to 45 deg bilaterally  SLR laying: Negative  XSLR laying: Negative  Palpable tenderness: Tender over the right sacroiliac joint. FABER: Positive Faber on the right. Sensory change: Gross sensation intact to all lumbar and sacral dermatomes.  Reflexes: 2+ at both patellar tendons, 2+ at achilles tendons, Babinski's downgoing.  Strength at foot  Plantar-flexion: 5/5 Dorsi-flexion: 5/5 Eversion: 5/5 Inversion: 5/5  Leg strength  Quad: 5/5 Hamstring: 5/5 Hip flexor: 5/5 Hip abductors: 5/5  Gait unremarkable.  Osteopathic findings C3 flexed rotated and side bent right C6 flexed  rotated and side bent left T4 extended rotated and side bent right inhaled rib T9 extended rotated and side bent left L2 flexed rotated and side bent right Sacrum right on right    Impression and Recommendations:     This case required medical decision making of moderate complexity. The above documentation has been reviewed and is accurate and complete [name], DO       Note: This dictation was prepared with Dragon dictation along with smaller phrase technology. Any transcriptional errors that result from this process are unintentional.

## 2018-07-18 NOTE — Assessment & Plan Note (Signed)
Stable overall.  More muscle imbalances.  Discussed posture and ergonomics.  Discussed which activities of doing which wants to avoid.  Patient will increase activity slowly again.  Discussed the importance of the hip abductor strengthening.  Follow-up again in 4 to 8 weeks

## 2018-07-18 NOTE — Assessment & Plan Note (Signed)
Decision today to treat with OMT was based on Physical Exam  After verbal consent patient was treated with HVLA, ME, FPR techniques in cervical, thoracic, rib lumbar and sacral areas  Patient tolerated the procedure well with improvement in symptoms  Patient given exercises, stretches and lifestyle modifications  See medications in patient instructions if given  Patient will follow up in 4-8 weeks 

## 2018-08-28 NOTE — Progress Notes (Signed)
[name] [address], [place] [address] Phone: [phone] Subjective:   I [name] am serving as a Education administrator for Dr. Hulan Saas.  I'm seeing this patient by the request  of:    CC: Back pain follow-up  [id number]  Dawn [name] is a 48 y.o. female coming in with complaint of back pain. States she occasionally gets worsening pain after a lot of cleaning and ADLs. Soreness between her shoulder blades along with lower back pain. States that overall her pain does not prevent her from doing things.     Past Medical History:  Diagnosis Date  . Allergy   . Frequent headaches   . GERD (gastroesophageal reflux disease)   . H/O sinus surgery    No past surgical history on file. Social History   Socioeconomic History  . Marital status: Married    Spouse name: Not on file  . Number of children: 3  . Years of education: Not on file  . Highest education level: Master's degree (e.g., MA, MS, MEng, MEd, MSW, MBA)  Occupational History  . Not on file  Social Needs  . Financial resource strain: Not on file  . Food insecurity    Worry: Not on file    Inability: Not on file  . Transportation needs    Medical: Not on file    Non-medical: Not on file  Tobacco Use  . Smoking status: Never Smoker  . Smokeless tobacco: Never Used  Substance and Sexual Activity  . Alcohol use: Yes  . Drug use: No  . Sexual activity: Yes    Birth control/protection: I.U.D.  Lifestyle  . Physical activity    Days per week: Not on file    Minutes per session: Not on file  . Stress: Not on file  Relationships  . Social Herbalist on phone: Not on file    Gets together: Not on file    Attends religious service: Not on file    Active member of club or organization: Not on file    Attends meetings of clubs or organizations: Not on file    Relationship status: Not on file  Other Topics Concern  . Not on file  Social History Narrative  .  Not on file   Allergies  Allergen Reactions  . Amoxicillin    Family History  Problem Relation Age of Onset  . Asthma Mother   . Hyperlipidemia Mother   . Cancer Maternal Grandmother   . Hypertension Maternal Grandmother   . Kidney disease Maternal Grandmother   . Cancer Maternal Grandfather   . Early death Paternal Grandmother   . Diabetes Paternal Grandfather   . Arthritis Paternal Grandfather   . Hypertension Paternal Grandfather     Current Outpatient Medications (Endocrine & Metabolic):  .  levonorgestrel (MIRENA, 52 MG,) 20 MCG/24HR IUD, Mirena 20 mcg/24 hr (5 years) intrauterine device  Take 1 device by intrauterine route.   Current Outpatient Medications (Respiratory):  .  loratadine (CLARITIN) 10 MG tablet, Take 1 tablet (10 mg total) by mouth daily.    Current Outpatient Medications (Other):  Marland Kitchen  Cholecalciferol (VITAMIN D3) 2000 units capsule, Take 1 capsule (2,000 Units total) by mouth daily. .  Diclofenac Sodium (PENNSAID) 2 % SOLN, Place 2 g onto the skin 2 (two) times daily. .  hyoscyamine (LEVSIN, ANASPAZ) 0.125 MG tablet, Take 1-2 tablets (0.125-0.25 mg total) by mouth every 4 (four) hours as needed for up  to 10 days for cramping. .  pantoprazole (PROTONIX) 40 MG tablet, TAKE 1 TABLET BY MOUTH TWICE A DAY .  polyethylene glycol powder (GLYCOLAX/MIRALAX) powder, Take 17 g by mouth 2 (two) times daily as needed for moderate constipation.    Past medical history, social, surgical and family history all reviewed in electronic medical record.  No pertanent information unless stated regarding to the chief complaint.   Review of Systems:  No headache, visual changes, nausea, vomiting, diarrhea, constipation, dizziness, abdominal pain, skin rash, fevers, chills, night sweats, weight loss, swollen lymph nodes, body aches, joint swelling, muscle aches, chest pain, shortness of breath, mood changes.   Objective  There were no vitals taken for this visit. Systems  examined below as of    General: No apparent distress alert and oriented x3 mood and affect normal, dressed appropriately.  HEENT: Pupils equal, extraocular movements intact  Respiratory: Patient's speak in full sentences and does not appear short of breath  Cardiovascular: No lower extremity edema, non tender, no erythema  Skin: Warm dry intact with no signs of infection or rash on extremities or on axial skeleton.  Abdomen: Soft nontender  Neuro: Cranial nerves II through XII are intact, neurovascularly intact in all extremities with 2+ DTRs and 2+ pulses.  Lymph: No lymphadenopathy of posterior or anterior cervical chain or axillae bilaterally.  Gait normal with good balance and coordination.  MSK:  Non tender with full range of motion and good stability and symmetric strength and tone of shoulders, elbows, wrist, hip, knee and ankles bilaterally.  Back Exam:  Inspection: Loss of lordosis Motion: Flexion 45 deg, Extension 15 deg, Side Bending to 35 deg bilaterally,  Rotation to 45 deg bilaterally  SLR laying: Negative  XSLR laying: Negative  Palpable tenderness: Tender to palpation paraspinal musculature lumbar spine. FABER: Positive right greater than left. Sensory change: Gross sensation intact to all lumbar and sacral dermatomes.  Reflexes: 2+ at both patellar tendons, 2+ at achilles tendons, Babinski's downgoing.  Strength at foot  Plantar-flexion: 5/5 Dorsi-flexion: 5/5 Eversion: 5/5 Inversion: 5/5  Leg strength  Quad: 5/5 Hamstring: 5/5 Hip flexor: 5/5 Hip abductors: 5/5  Gait unremarkable.  Osteopathic findings C4 flexed rotated and side bent left C6 flexed rotated and side bent left T3 extended rotated and side bent right inhaled third rib T9 extended rotated and side bent left L2 flexed rotated and side bent right Sacrum right on right    Impression and Recommendations:     This case required medical decision making of moderate complexity. The above documentation  has been reviewed and is accurate and complete [name], DO       Note: This dictation was prepared with Dragon dictation along with smaller phrase technology. Any transcriptional errors that result from this process are unintentional.

## 2018-08-28 NOTE — Assessment & Plan Note (Signed)
Decision today to treat with OMT was based on Physical Exam  After verbal consent patient was treated with HVLA, ME, FPR techniques in cervical, thoracic, rib,  lumbar and sacral areas  Patient tolerated the procedure well with improvement in symptoms  Patient given exercises, stretches and lifestyle modifications  See medications in patient instructions if given  Patient will follow up in 4-8 weeks 

## 2018-08-28 NOTE — Assessment & Plan Note (Signed)
Stable overall.  Discussed with patient about core strengthening including hip abductors.  Patient will follow-up in 4 to 8 weeks

## 2018-08-29 ENCOUNTER — Encounter: Payer: Self-pay | Admitting: Family Medicine

## 2018-08-29 ENCOUNTER — Other Ambulatory Visit: Payer: Self-pay

## 2018-08-29 ENCOUNTER — Ambulatory Visit: Payer: 59 | Admitting: Family Medicine

## 2018-08-29 VITALS — BP 100/70 | HR 69 | Ht 65.0 in | Wt 153.0 lb

## 2018-08-29 DIAGNOSIS — M545 Low back pain: Secondary | ICD-10-CM

## 2018-08-29 DIAGNOSIS — M999 Biomechanical lesion, unspecified: Secondary | ICD-10-CM

## 2018-08-29 DIAGNOSIS — G8929 Other chronic pain: Secondary | ICD-10-CM | POA: Diagnosis not present

## 2018-08-29 NOTE — Patient Instructions (Signed)
Good to see you  proud of you  See me again in 7 weeks

## 2018-09-01 ENCOUNTER — Telehealth: Payer: Self-pay | Admitting: *Deleted

## 2018-09-01 NOTE — Telephone Encounter (Signed)
Copied from [place] [id number]. Topic: General - Other >> Sep 01, 2018 10:16 AM Leward Quan A wrote: Reason for CRM: Patient called to say that a friend son was in a daycare where a child tested positive for corona virus. She have not been personally in contact with this child only the mother but patient would like Dr Alain Marion to tell her should she go and get tested. Patient can be reached at Ph#  [phone]

## 2018-09-01 NOTE — Telephone Encounter (Signed)
It is very unlikely that she will get infected.  Kids don't seem to pass the disease on as efficiently as adults do, and cases of child-to-child or child to adult infection are uncommon.  If she is worried, she can get tested however. Thanks

## 2018-09-02 NOTE — Telephone Encounter (Signed)
Left message for patient to call back  

## 2018-09-02 NOTE — Telephone Encounter (Signed)
Pt informed of below. She states the child's mother (her friend)  was tested on 09/01/18. She will call us back for testing if her friend tests positive.

## 2018-09-13 ENCOUNTER — Telehealth: Payer: Self-pay

## 2018-09-13 NOTE — Telephone Encounter (Signed)
Called and rescheduled 9/14 appointment

## 2018-10-13 DIAGNOSIS — H04123 Dry eye syndrome of bilateral lacrimal glands: Secondary | ICD-10-CM | POA: Insufficient documentation

## 2018-10-13 DIAGNOSIS — H53149 Visual discomfort, unspecified: Secondary | ICD-10-CM | POA: Insufficient documentation

## 2018-10-13 DIAGNOSIS — R519 Headache, unspecified: Secondary | ICD-10-CM | POA: Insufficient documentation

## 2018-10-13 DIAGNOSIS — H5203 Hypermetropia, bilateral: Secondary | ICD-10-CM | POA: Insufficient documentation

## 2018-10-13 DIAGNOSIS — H524 Presbyopia: Secondary | ICD-10-CM | POA: Insufficient documentation

## 2018-10-16 NOTE — Progress Notes (Signed)
[name] [address], [place] [address] Phone: [phone] Subjective:      CC: Low back pain follow-up  [id number]  Dawn [name] is a 48 y.o. female coming in with complaint of back pain. Last seen in July. Patient states that 3 weeks ago she had back spasms. Has felt improvement but has residual soreness. Pain with deep flexion and side bending with rotation. Patient was having radiating symptoms. This has subsided. Has had acupuncture.      Past Medical History:  Diagnosis Date  . Allergy   . Frequent headaches   . GERD (gastroesophageal reflux disease)   . H/O sinus surgery    No past surgical history on file. Social History   Socioeconomic History  . Marital status: Married    Spouse name: Not on file  . Number of children: 3  . Years of education: Not on file  . Highest education level: Master's degree (e.g., MA, MS, MEng, MEd, MSW, MBA)  Occupational History  . Not on file  Social Needs  . Financial resource strain: Not on file  . Food insecurity    Worry: Not on file    Inability: Not on file  . Transportation needs    Medical: Not on file    Non-medical: Not on file  Tobacco Use  . Smoking status: Never Smoker  . Smokeless tobacco: Never Used  Substance and Sexual Activity  . Alcohol use: Yes  . Drug use: No  . Sexual activity: Yes    Birth control/protection: I.U.D.  Lifestyle  . Physical activity    Days per week: Not on file    Minutes per session: Not on file  . Stress: Not on file  Relationships  . Social Herbalist on phone: Not on file    Gets together: Not on file    Attends religious service: Not on file    Active member of club or organization: Not on file    Attends meetings of clubs or organizations: Not on file    Relationship status: Not on file  Other Topics Concern  . Not on file  Social History Narrative  . Not on file   Allergies  Allergen Reactions  .  Amoxicillin    Family History  Problem Relation Age of Onset  . Asthma Mother   . Hyperlipidemia Mother   . Cancer Maternal Grandmother   . Hypertension Maternal Grandmother   . Kidney disease Maternal Grandmother   . Cancer Maternal Grandfather   . Early death Paternal Grandmother   . Diabetes Paternal Grandfather   . Arthritis Paternal Grandfather   . Hypertension Paternal Grandfather     Current Outpatient Medications (Endocrine & Metabolic):  .  levonorgestrel (MIRENA, 52 MG,) 20 MCG/24HR IUD, Mirena 20 mcg/24 hr (5 years) intrauterine device  Take 1 device by intrauterine route.   Current Outpatient Medications (Respiratory):  .  loratadine (CLARITIN) 10 MG tablet, Take 1 tablet (10 mg total) by mouth daily.    Current Outpatient Medications (Other):  Marland Kitchen  Cholecalciferol (VITAMIN D3) 2000 units capsule, Take 1 capsule (2,000 Units total) by mouth daily. .  Diclofenac Sodium (PENNSAID) 2 % SOLN, Place 2 g onto the skin 2 (two) times daily. .  hyoscyamine (LEVSIN, ANASPAZ) 0.125 MG tablet, Take 1-2 tablets (0.125-0.25 mg total) by mouth every 4 (four) hours as needed for up to 10 days for cramping. .  pantoprazole (PROTONIX) 40 MG tablet, TAKE 1  TABLET BY MOUTH TWICE A DAY .  polyethylene glycol powder (GLYCOLAX/MIRALAX) powder, Take 17 g by mouth 2 (two) times daily as needed for moderate constipation.    Past medical history, social, surgical and family history all reviewed in electronic medical record.  No pertanent information unless stated regarding to the chief complaint.   Review of Systems:  No headache, visual changes, nausea, vomiting, diarrhea, constipation, dizziness, abdominal pain, skin rash, fevers, chills, night sweats, weight loss, swollen lymph nodes, body aches, joint swelling,  chest pain, shortness of breath, mood changes.  Positive muscle aches  Objective  Blood pressure 120/76, pulse 67, height 5\' 5"  (1.651 m), weight 149 lb (67.6 kg), SpO2 98 %.     General: No apparent distress alert and oriented x3 mood and affect normal, dressed appropriately.  HEENT: Pupils equal, extraocular movements intact  Respiratory: Patient's speak in full sentences and does not appear short of breath  Cardiovascular: No lower extremity edema, non tender, no erythema  Skin: Warm dry intact with no signs of infection or rash on extremities or on axial skeleton.  Abdomen: Soft nontender  Neuro: Cranial nerves II through XII are intact, neurovascularly intact in all extremities with 2+ DTRs and 2+ pulses.  Lymph: No lymphadenopathy of posterior or anterior cervical chain or axillae bilaterally.  Gait normal with good balance and coordination.  MSK:  Non tender with full range of motion and good stability and symmetric strength and tone of shoulders, elbows, wrist, hip, knee and ankles bilaterally.  Back Exam:  Inspection: Unremarkable  Motion: Flexion 45 deg, Extension 25 deg, Side Bending to 35 deg bilaterally,  Rotation to 45 deg bilaterally  SLR laying: Negative  XSLR laying: Negative  Palpable tenderness: Tender to palpation in the paraspinal musculature lumbar spine more over the L5-S1 right greater than left. FABER: negative. Sensory change: Gross sensation intact to all lumbar and sacral dermatomes.  Reflexes: 2+ at both patellar tendons, 2+ at achilles tendons, Babinski's downgoing.  Strength at foot  Plantar-flexion: 5/5 Dorsi-flexion: 5/5 Eversion: 5/5 Inversion: 5/5  Leg strength  Quad: 5/5 Hamstring: 5/5 Hip flexor: 5/5 Hip abductors: 5/5  Gait unremarkable.  Osteopathic findings  C4 flexed rotated and side bent left C6 flexed rotated and side bent left T5 extended rotated and side bent right inhaled rib T9 extended rotated and side bent left L2 flexed rotated and side bent right Sacrum right on right    Impression and Recommendations:     This case required medical decision making of moderate complexity. The above documentation has  been reviewed and is accurate and complete [name], DO       Note: This dictation was prepared with Dragon dictation along with smaller phrase technology. Any transcriptional errors that result from this process are unintentional.

## 2018-10-16 NOTE — Assessment & Plan Note (Signed)
Decision today to treat with OMT was based on Physical Exam  After verbal consent patient was treated with HVLA, ME, FPR techniques in cervical, thoracic, rib,  lumbar and sacral areas  Patient tolerated the procedure well with improvement in symptoms  Patient given exercises, stretches and lifestyle modifications  See medications in patient instructions if given  Patient will follow up in 4-8 weeks 

## 2018-10-16 NOTE — Assessment & Plan Note (Addendum)
Stable overall but mild exacerbation.  Nothing that seems to be debilitating.  Does respond well to osteopathic manipulation.  Discussed posture and ergonomics.  Follow-up again in 4 to 8 weeks

## 2018-10-17 ENCOUNTER — Ambulatory Visit: Payer: 59 | Admitting: Family Medicine

## 2018-10-18 ENCOUNTER — Other Ambulatory Visit: Payer: Self-pay

## 2018-10-18 ENCOUNTER — Ambulatory Visit: Payer: 59 | Admitting: Family Medicine

## 2018-10-18 ENCOUNTER — Encounter: Payer: Self-pay | Admitting: Family Medicine

## 2018-10-18 VITALS — BP 120/76 | HR 67 | Ht 65.0 in | Wt 149.0 lb

## 2018-10-18 DIAGNOSIS — M545 Low back pain, unspecified: Secondary | ICD-10-CM

## 2018-10-18 DIAGNOSIS — M999 Biomechanical lesion, unspecified: Secondary | ICD-10-CM

## 2018-10-18 DIAGNOSIS — G8929 Other chronic pain: Secondary | ICD-10-CM | POA: Diagnosis not present

## 2018-10-18 MED ORDER — METHYLPREDNISOLONE ACETATE 80 MG/ML IJ SUSP
80.0000 mg | Freq: Once | INTRAMUSCULAR | Status: AC
  Administered 2018-10-18: 80 mg via INTRAMUSCULAR

## 2018-10-18 MED ORDER — KETOROLAC TROMETHAMINE 60 MG/2ML IM SOLN
60.0000 mg | Freq: Once | INTRAMUSCULAR | Status: AC
  Administered 2018-10-18: 60 mg via INTRAMUSCULAR

## 2018-11-09 ENCOUNTER — Other Ambulatory Visit: Payer: Self-pay

## 2018-11-09 ENCOUNTER — Encounter: Payer: Self-pay | Admitting: Family Medicine

## 2018-11-09 ENCOUNTER — Ambulatory Visit: Payer: 59 | Admitting: Family Medicine

## 2018-11-09 VITALS — BP 92/64 | HR 73 | Ht 65.0 in | Wt 151.0 lb

## 2018-11-09 DIAGNOSIS — G8929 Other chronic pain: Secondary | ICD-10-CM

## 2018-11-09 DIAGNOSIS — M545 Low back pain: Secondary | ICD-10-CM

## 2018-11-09 DIAGNOSIS — M999 Biomechanical lesion, unspecified: Secondary | ICD-10-CM | POA: Diagnosis not present

## 2018-11-09 NOTE — Patient Instructions (Signed)
See me in 5 weeks

## 2018-11-09 NOTE — Assessment & Plan Note (Signed)
Decision today to treat with OMT was based on Physical Exam  After verbal consent patient was treated with HVLA, ME, FPR techniques in cervical, thoracic, rib,  lumbar and sacral areas  Patient tolerated the procedure well with improvement in symptoms  Patient given exercises, stretches and lifestyle modifications  See medications in patient instructions if given  Patient will follow up in 4-8 weeks 

## 2018-11-09 NOTE — Assessment & Plan Note (Signed)
Improvement from previous exam.  Discussed posture and ergonomics, discussed which activities to do which was to avoid.  Patient is making progress already with conservative therapy.  Follow-up again in 4 to 8 weeks

## 2018-11-09 NOTE — Progress Notes (Signed)
[name] [place], [place] [address] Phone: [phone] Subjective:    I'm seeing this patient by the request  of:    CC: Low back pain follow-up  [id number]    I, Wendy Poet, LAT, ATC am serving as scribe for Dr. Hulan Saas.  10/18/18: Stable overall but mild exacerbation.  Nothing that seems to be debilitating.  Does respond well to osteopathic manipulation.  Discussed posture and ergonomics.  Follow-up again in 4 to 8 weeks  Update- 11/09/18: Dawn [name] is a 48 y.o. female coming in with complaint of back pain.  Pt states that overall she is better but reports that the pain is not completely gone.  She states that she has increased pain w/ forward flexion and transitioning from supine to stand.  Pt denies any LE radicular symptoms.  Patient feels like the medication did make significant improvement.  Patient has been able to still work out on a regular basis.     Past Medical History:  Diagnosis Date  . Allergy   . Frequent headaches   . GERD (gastroesophageal reflux disease)   . H/O sinus surgery    No past surgical history on file. Social History   Socioeconomic History  . Marital status: Married    Spouse name: Not on file  . Number of children: 3  . Years of education: Not on file  . Highest education level: Master's degree (e.g., MA, MS, MEng, MEd, MSW, MBA)  Occupational History  . Not on file  Social Needs  . Financial resource strain: Not on file  . Food insecurity    Worry: Not on file    Inability: Not on file  . Transportation needs    Medical: Not on file    Non-medical: Not on file  Tobacco Use  . Smoking status: Never Smoker  . Smokeless tobacco: Never Used  Substance and Sexual Activity  . Alcohol use: Yes  . Drug use: No  . Sexual activity: Yes    Birth control/protection: I.U.D.  Lifestyle  . Physical activity    Days per week: Not on file    Minutes per session: Not on file  .  Stress: Not on file  Relationships  . Social Herbalist on phone: Not on file    Gets together: Not on file    Attends religious service: Not on file    Active member of club or organization: Not on file    Attends meetings of clubs or organizations: Not on file    Relationship status: Not on file  Other Topics Concern  . Not on file  Social History Narrative  . Not on file   Allergies  Allergen Reactions  . Amoxicillin    Family History  Problem Relation Age of Onset  . Asthma Mother   . Hyperlipidemia Mother   . Cancer Maternal Grandmother   . Hypertension Maternal Grandmother   . Kidney disease Maternal Grandmother   . Cancer Maternal Grandfather   . Early death Paternal Grandmother   . Diabetes Paternal Grandfather   . Arthritis Paternal Grandfather   . Hypertension Paternal Grandfather     Current Outpatient Medications (Endocrine & Metabolic):  .  levonorgestrel (MIRENA, 52 MG,) 20 MCG/24HR IUD, Mirena 20 mcg/24 hr (5 years) intrauterine device  Take 1 device by intrauterine route.   Current Outpatient Medications (Respiratory):  .  loratadine (CLARITIN) 10 MG tablet, Take 1 tablet (10 mg total)  by mouth daily.    Current Outpatient Medications (Other):  Marland Kitchen  Cholecalciferol (VITAMIN D3) 2000 units capsule, Take 1 capsule (2,000 Units total) by mouth daily. .  pantoprazole (PROTONIX) 40 MG tablet, TAKE 1 TABLET BY MOUTH TWICE A DAY .  polyethylene glycol powder (GLYCOLAX/MIRALAX) powder, Take 17 g by mouth 2 (two) times daily as needed for moderate constipation. .  Diclofenac Sodium (PENNSAID) 2 % SOLN, Place 2 g onto the skin 2 (two) times daily. (Patient not taking: Reported on 11/09/2018) .  hyoscyamine (LEVSIN, ANASPAZ) 0.125 MG tablet, Take 1-2 tablets (0.125-0.25 mg total) by mouth every 4 (four) hours as needed for up to 10 days for cramping.    Past medical history, social, surgical and family history all reviewed in electronic medical record.   No pertanent information unless stated regarding to the chief complaint.   Review of Systems:  No headache, visual changes, nausea, vomiting, diarrhea, constipation, dizziness, abdominal pain, skin rash, fevers, chills, night sweats, weight loss, swollen lymph nodes, body aches, joint swelling, muscle aches, chest pain, shortness of breath, mood changes.   Objective  Blood pressure 92/64, pulse 73, height 5\' 5"  (1.651 m), weight 151 lb (68.5 kg), SpO2 95 %.   General: No apparent distress alert and oriented x3 mood and affect normal, dressed appropriately.  HEENT: Pupils equal, extraocular movements intact  Respiratory: Patient's speak in full sentences and does not appear short of breath  Cardiovascular: No lower extremity edema, non tender, no erythema  Skin: Warm dry intact with no signs of infection or rash on extremities or on axial skeleton.  Abdomen: Soft nontender  Neuro: Cranial nerves II through XII are intact, neurovascularly intact in all extremities with 2+ DTRs and 2+ pulses.  Lymph: No lymphadenopathy of posterior or anterior cervical chain or axillae bilaterally.  Gait normal with good balance and coordination.  MSK:  tender with full range of motion and good stability and symmetric strength and tone of  elbows, wrist, hip, knee and ankles bilaterally.  Neck exam has some mild loss of lordosis.  Negative Spurling's.  Tender to palpation in the parascapular region right greater than left.  Low back exam has some tightness but less tightness than previous exam.  Negative straight leg test, and tightness with Corky Sox test right and left.  Osteopathic findings  C4 flexed rotated and side bent left C6 flexed rotated and side bent left T3 extended rotated and side bent right inhaled third rib T9 extended rotated and side bent left L2 flexed rotated and side bent right Sacrum right on right    Impression and Recommendations:     This case required medical decision making of  moderate complexity. The above documentation has been reviewed and is accurate and complete Dawn Pulley, DO       Note: This dictation was prepared with Dragon dictation along with smaller phrase technology. Any transcriptional errors that result from this process are unintentional.

## 2018-12-15 ENCOUNTER — Encounter: Payer: Self-pay | Admitting: Family Medicine

## 2018-12-15 ENCOUNTER — Other Ambulatory Visit: Payer: Self-pay

## 2018-12-15 ENCOUNTER — Ambulatory Visit: Payer: 59 | Admitting: Family Medicine

## 2018-12-15 VITALS — BP 108/70 | HR 75 | Ht 65.0 in | Wt 150.0 lb

## 2018-12-15 DIAGNOSIS — G8929 Other chronic pain: Secondary | ICD-10-CM

## 2018-12-15 DIAGNOSIS — M999 Biomechanical lesion, unspecified: Secondary | ICD-10-CM

## 2018-12-15 DIAGNOSIS — M545 Low back pain: Secondary | ICD-10-CM | POA: Diagnosis not present

## 2018-12-15 NOTE — Progress Notes (Signed)
[name] [place], [place] [address] Phone: [phone] Subjective:   [name], am serving as a scribe for Dr. Hulan Saas.    CC: Neck and back pain follow-up  [id number]  Kehaulani Rueb is a 48 y.o. female coming in with complaint of neck and back pain. Last seen on 11/09/2018 for OMT. Patient states that she had no pain at all for 2 weeks while she was on a high dose of IBU for tooth pain. Is now experiencing more lower back pain recently since coming off the IBU.   Also complaining of left knee pain. Pain after tennis. Pain is medial. Pain is intermittent and does not last long. Has a brace but does not wear it much.      Past Medical History:  Diagnosis Date  . Allergy   . Frequent headaches   . GERD (gastroesophageal reflux disease)   . H/O sinus surgery    No past surgical history on file. Social History   Socioeconomic History  . Marital status: Married    Spouse name: Not on file  . Number of children: 3  . Years of education: Not on file  . Highest education level: Master's degree (e.g., MA, MS, MEng, MEd, MSW, MBA)  Occupational History  . Not on file  Social Needs  . Financial resource strain: Not on file  . Food insecurity    Worry: Not on file    Inability: Not on file  . Transportation needs    Medical: Not on file    Non-medical: Not on file  Tobacco Use  . Smoking status: Never Smoker  . Smokeless tobacco: Never Used  Substance and Sexual Activity  . Alcohol use: Yes  . Drug use: No  . Sexual activity: Yes    Birth control/protection: I.U.D.  Lifestyle  . Physical activity    Days per week: Not on file    Minutes per session: Not on file  . Stress: Not on file  Relationships  . Social Herbalist on phone: Not on file    Gets together: Not on file    Attends religious service: Not on file    Active member of club or organization: Not on file    Attends meetings of clubs  or organizations: Not on file    Relationship status: Not on file  Other Topics Concern  . Not on file  Social History Narrative  . Not on file   Allergies  Allergen Reactions  . Amoxicillin    Family History  Problem Relation Age of Onset  . Asthma Mother   . Hyperlipidemia Mother   . Cancer Maternal Grandmother   . Hypertension Maternal Grandmother   . Kidney disease Maternal Grandmother   . Cancer Maternal Grandfather   . Early death Paternal Grandmother   . Diabetes Paternal Grandfather   . Arthritis Paternal Grandfather   . Hypertension Paternal Grandfather     Current Outpatient Medications (Endocrine & Metabolic):  .  levonorgestrel (MIRENA, 52 MG,) 20 MCG/24HR IUD, Mirena 20 mcg/24 hr (5 years) intrauterine device  Take 1 device by intrauterine route.   Current Outpatient Medications (Respiratory):  .  loratadine (CLARITIN) 10 MG tablet, Take 1 tablet (10 mg total) by mouth daily.    Current Outpatient Medications (Other):  Marland Kitchen  Cholecalciferol (VITAMIN D3) 2000 units capsule, Take 1 capsule (2,000 Units total) by mouth daily. .  Diclofenac Sodium (PENNSAID) 2 % SOLN,  Place 2 g onto the skin 2 (two) times daily. .  pantoprazole (PROTONIX) 40 MG tablet, TAKE 1 TABLET BY MOUTH TWICE A DAY .  polyethylene glycol powder (GLYCOLAX/MIRALAX) powder, Take 17 g by mouth 2 (two) times daily as needed for moderate constipation. .  hyoscyamine (LEVSIN, ANASPAZ) 0.125 MG tablet, Take 1-2 tablets (0.125-0.25 mg total) by mouth every 4 (four) hours as needed for up to 10 days for cramping.    Past medical history, social, surgical and family history all reviewed in electronic medical record.  No pertanent information unless stated regarding to the chief complaint.   Review of Systems:  No headache, visual changes, nausea, vomiting, diarrhea, constipation, dizziness, abdominal pain, skin rash, fevers, chills, night sweats, weight loss, swollen lymph nodes, body aches, joint  swelling,  chest pain, shortness of breath, mood changes.  Mild positive muscle aches  Objective  Blood pressure 108/70, pulse 75, height 5\' 5"  (1.651 m), weight 150 lb (68 kg), SpO2 97 %.    General: No apparent distress alert and oriented x3 mood and affect normal, dressed appropriately.  HEENT: Pupils equal, extraocular movements intact  Respiratory: Patient's speak in full sentences and does not appear short of breath  Cardiovascular: No lower extremity edema, non tender, no erythema  Skin: Warm dry intact with no signs of infection or rash on extremities or on axial skeleton.  Abdomen: Soft nontender  Neuro: Cranial nerves II through XII are intact, neurovascularly intact in all extremities with 2+ DTRs and 2+ pulses.  Lymph: No lymphadenopathy of posterior or anterior cervical chain or axillae bilaterally.  Gait normal with good balance and coordination.  MSK:  Non tender with full range of motion and good stability and symmetric strength and tone of shoulders, elbows, wrist, hip, knee and ankles bilaterally.  Neck exam shows the patient does have some tenderness to palpation in the parascapular region negative Spurling's.  No scapular dyskinesis noted today.  Tightness in the thoracolumbar juncture right greater than left. Tightness in the sacroiliac joint bilaterally.  Osteopathic findings C2 flexed rotated and side bent right C4 flexed rotated and side bent left C6 flexed rotated and side bent left T3 extended rotated and side bent right inhaled third rib L1 flexed rotated and side bent left  Sacrum left non left       Impression and Recommendations:     This case required medical decision making of moderate complexity. The above documentation has been reviewed and is accurate and complete [name], DO       Note: This dictation was prepared with Dragon dictation along with smaller phrase technology. Any transcriptional errors that result from this process are  unintentional.

## 2018-12-15 NOTE — Assessment & Plan Note (Signed)
Improvement noted.  Patient is making some progress.  Encouraged her to continue to work on core strengthening exercises.  Discussed icing regimen, home exercises, which activities to do which was to avoid.  Patient will increase activity as tolerated.  Follow-up again in 4 to 8 weeks

## 2018-12-15 NOTE — Patient Instructions (Signed)
See me again in 7-8 weeks 

## 2018-12-15 NOTE — Assessment & Plan Note (Signed)
Decision today to treat with OMT was based on Physical Exam  After verbal consent patient was treated with HVLA, ME, FPR techniques in cervical, thoracic, rib lumbar and sacral areas  Patient tolerated the procedure well with improvement in symptoms  Patient given exercises, stretches and lifestyle modifications  See medications in patient instructions if given  Patient will follow up in 4-8 weeks 

## 2018-12-21 ENCOUNTER — Other Ambulatory Visit: Payer: Self-pay

## 2018-12-21 DIAGNOSIS — Z20822 Contact with and (suspected) exposure to covid-19: Secondary | ICD-10-CM

## 2018-12-24 LAB — SPECIMEN STATUS REPORT

## 2018-12-24 LAB — NOVEL CORONAVIRUS, NAA: SARS-CoV-2, NAA: NOT DETECTED

## 2019-02-08 ENCOUNTER — Encounter: Payer: Self-pay | Admitting: Family Medicine

## 2019-02-08 ENCOUNTER — Ambulatory Visit (INDEPENDENT_AMBULATORY_CARE_PROVIDER_SITE_OTHER): Payer: 59 | Admitting: Family Medicine

## 2019-02-08 ENCOUNTER — Other Ambulatory Visit: Payer: Self-pay

## 2019-02-08 VITALS — BP 100/62 | HR 60 | Ht 65.0 in | Wt 155.0 lb

## 2019-02-08 DIAGNOSIS — M999 Biomechanical lesion, unspecified: Secondary | ICD-10-CM

## 2019-02-08 DIAGNOSIS — M545 Low back pain: Secondary | ICD-10-CM

## 2019-02-08 DIAGNOSIS — G8929 Other chronic pain: Secondary | ICD-10-CM | POA: Diagnosis not present

## 2019-02-08 NOTE — Assessment & Plan Note (Signed)
Patient has made significant progress over the course of time at this moment.  Patient is to continue with the icing regimen, home exercise, which activities to do which was to avoid.  Discussed icing regimen.  Follow-up again in 4 to 8 weeks

## 2019-02-08 NOTE — Patient Instructions (Signed)
Doing great See me again in 6 weeks

## 2019-02-08 NOTE — Assessment & Plan Note (Signed)
Decision today to treat with OMT was based on Physical Exam  After verbal consent patient was treated with HVLA, ME, FPR techniques in cervical, thoracic, rib, lumbar and sacral areas  Patient tolerated the procedure well with improvement in symptoms  Patient given exercises, stretches and lifestyle modifications  See medications in patient instructions if given  Patient will follow up in 6-8 weeks 

## 2019-02-08 NOTE — Progress Notes (Signed)
[facility] [address] [place] Phone: [phone] Subjective:   I Dawn [name] am serving as a Education administrator for Dr. Hulan Saas.  This visit occurred during the SARS-CoV-2 public health emergency.  Safety protocols were in place, including screening questions prior to the visit, additional usage of staff PPE, and extensive cleaning of exam room while observing appropriate contact time as indicated for disinfecting solutions.   I'm seeing this patient by the request  of:    CC: Low back pain follow-up  [id number]  Dawn [name] is a 49 y.o. female coming in with complaint of back pain. Patient states the last couple of days she has felt pain.Patient states that it has been doing relatively well has been trying to work out at home and no longer going to the gym secondary to the coronavirus outbreak       Past Medical History:  Diagnosis Date  . Allergy   . Frequent headaches   . GERD (gastroesophageal reflux disease)   . H/O sinus surgery    No past surgical history on file. Social History   Socioeconomic History  . Marital status: Married    Spouse name: Not on file  . Number of children: 3  . Years of education: Not on file  . Highest education level: Master's degree (e.g., MA, MS, MEng, MEd, MSW, MBA)  Occupational History  . Not on file  Tobacco Use  . Smoking status: Never Smoker  . Smokeless tobacco: Never Used  Substance and Sexual Activity  . Alcohol use: Yes  . Drug use: No  . Sexual activity: Yes    Birth control/protection: I.U.D.  Other Topics Concern  . Not on file  Social History Narrative  . Not on file   Social Determinants of Health   Financial Resource Strain:   . Difficulty of Paying Living Expenses: Not on file  Food Insecurity:   . Worried About Charity fundraiser in the Last Year: Not on file  . Ran Out of Food in the Last Year: Not on file  Transportation Needs:   . Lack of  Transportation (Medical): Not on file  . Lack of Transportation (Non-Medical): Not on file  Physical Activity:   . Days of Exercise per Week: Not on file  . Minutes of Exercise per Session: Not on file  Stress:   . Feeling of Stress : Not on file  Social Connections:   . Frequency of Communication with Friends and Family: Not on file  . Frequency of Social Gatherings with Friends and Family: Not on file  . Attends Religious Services: Not on file  . Active Member of Clubs or Organizations: Not on file  . Attends Archivist Meetings: Not on file  . Marital Status: Not on file   Allergies  Allergen Reactions  . Amoxicillin    Family History  Problem Relation Age of Onset  . Asthma Mother   . Hyperlipidemia Mother   . Cancer Maternal Grandmother   . Hypertension Maternal Grandmother   . Kidney disease Maternal Grandmother   . Cancer Maternal Grandfather   . Early death Paternal Grandmother   . Diabetes Paternal Grandfather   . Arthritis Paternal Grandfather   . Hypertension Paternal Grandfather     Current Outpatient Medications (Endocrine & Metabolic):  .  levonorgestrel (MIRENA, 52 MG,) 20 MCG/24HR IUD, Mirena 20 mcg/24 hr (5 years) intrauterine device  Take 1 device by intrauterine route.   Current  Outpatient Medications (Respiratory):  .  loratadine (CLARITIN) 10 MG tablet, Take 1 tablet (10 mg total) by mouth daily.    Current Outpatient Medications (Other):  Marland Kitchen  Cholecalciferol (VITAMIN D3) 2000 units capsule, Take 1 capsule (2,000 Units total) by mouth daily. .  Diclofenac Sodium (PENNSAID) 2 % SOLN, Place 2 g onto the skin 2 (two) times daily. .  pantoprazole (PROTONIX) 40 MG tablet, TAKE 1 TABLET BY MOUTH TWICE A DAY .  polyethylene glycol powder (GLYCOLAX/MIRALAX) powder, Take 17 g by mouth 2 (two) times daily as needed for moderate constipation. .  hyoscyamine (LEVSIN, ANASPAZ) 0.125 MG tablet, Take 1-2 tablets (0.125-0.25 mg total) by mouth every 4  (four) hours as needed for up to 10 days for cramping.    Past medical history, social, surgical and family history all reviewed in electronic medical record.  No pertanent information unless stated regarding to the chief complaint.   Review of Systems:  No headache, visual changes, nausea, vomiting, diarrhea, constipation, dizziness, abdominal pain, skin rash, fevers, chills, night sweats, weight loss, swollen lymph nodes, body aches, joint swelling,  chest pain, shortness of breath, mood changes.  Positive muscle aches  Objective  Blood pressure 100/62, pulse 60, height 5\' 5"  (1.651 m), weight 155 lb (70.3 kg), SpO2 98 %.    General: No apparent distress alert and oriented x3 mood and affect normal, dressed appropriately.  HEENT: Pupils equal, extraocular movements intact  Respiratory: Patient's speak in full sentences and does not appear short of breath  Cardiovascular: No lower extremity edema, non tender, no erythema  Skin: Warm dry intact with no signs of infection or rash on extremities or on axial skeleton.  Abdomen: Soft nontender  Neuro: Cranial nerves II through XII are intact, neurovascularly intact in all extremities with 2+ DTRs and 2+ pulses.  Lymph: No lymphadenopathy of posterior or anterior cervical chain or axillae bilaterally.  Gait normal with good balance and coordination.  MSK:  tender with mild limited range of motion and good stability and symmetric strength and tone of shoulders, elbows, wrist, hip, knee and ankles bilaterally.  Back exam does have some mild loss of lordosis.  Tightness in the parascapular and paraspinal musculature right side only.  Mild positive FABER test.  Negative straight leg test.  5-5 strength of the lower extremity that is symmetric to the contralateral side  Osteopathic findings  C2 flexed rotated and side bent right T9 extended rotated and side bent left inhaled rib L2 flexed rotated and side bent right Sacrum right on right      Impression and Recommendations:     This case required medical decision making of moderate complexity. The above documentation has been reviewed and is accurate and complete [name], DO       Note: This dictation was prepared with Dragon dictation along with smaller phrase technology. Any transcriptional errors that result from this process are unintentional.

## 2019-03-22 ENCOUNTER — Ambulatory Visit: Payer: 59 | Admitting: Family Medicine

## 2019-03-22 ENCOUNTER — Encounter: Payer: Self-pay | Admitting: Family Medicine

## 2019-03-22 ENCOUNTER — Other Ambulatory Visit: Payer: Self-pay

## 2019-03-22 VITALS — BP 102/78 | HR 76 | Ht 65.0 in | Wt 157.0 lb

## 2019-03-22 DIAGNOSIS — M545 Low back pain: Secondary | ICD-10-CM

## 2019-03-22 DIAGNOSIS — G8929 Other chronic pain: Secondary | ICD-10-CM | POA: Diagnosis not present

## 2019-03-22 DIAGNOSIS — M999 Biomechanical lesion, unspecified: Secondary | ICD-10-CM

## 2019-03-22 NOTE — Assessment & Plan Note (Signed)
Low back pain overall.  Patient does have a chronic problem but does seem to be stable.  Responding fairly well to conservative therapy.  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  We discussed different ergonomics throughout the day that might be beneficial.  Discussed icing regimen and home exercises.  Discussed topical anti-inflammatories as needed and over-the-counter natural supplementations.  Follow-up again in 4 to 8 weeks

## 2019-03-22 NOTE — Progress Notes (Signed)
[place] Phone: [phone] Subjective:   Dawn [name], am serving as a scribe for Dr. Hulan Saas. This visit occurred during the SARS-CoV-2 public health emergency.  Safety protocols were in place, including screening questions prior to the visit, additional usage of staff PPE, and extensive cleaning of exam room while observing appropriate contact time as indicated for disinfecting solutions.   I'm seeing this patient by the request  of:  Plotnikov, [name], MD  CC: Low back pain follow-up  [id number]  Dawn [name] is a 49 y.o. female coming in with complaint of back pain. Last seen on 02/08/2019. Patient states that she hasn't been as active lately.  Patient feels that as long as she is active she seems to be better.  Patient states that sometimes though it is a dull, throbbing aching pain.  [name] radiation down the legs or any numbness or tingling.    Past Medical History:  Diagnosis Date  . Allergy   . Frequent headaches   . GERD (gastroesophageal reflux disease)   . H/O sinus surgery    [name] past surgical history on file. Social History   Socioeconomic History  . Marital status: Married    Spouse name: Not on file  . Number of children: 3  . Years of education: Not on file  . Highest education level: Master's degree (e.g., MA, MS, MEng, MEd, MSW, MBA)  Occupational History  . Not on file  Tobacco Use  . Smoking status: Never Smoker  . Smokeless tobacco: Never Used  Substance and Sexual Activity  . Alcohol use: Yes  . Drug use: [name]  . Sexual activity: Yes    Birth control/protection: I.U.D.  Other Topics Concern  . Not on file  Social History Narrative  . Not on file   Social Determinants of Health   Financial Resource Strain:   . Difficulty of Paying Living Expenses: Not on file  Food Insecurity:   . Worried About Charity fundraiser in the Last Year: Not on file  . Ran Out of  Food in the Last Year: Not on file  Transportation Needs:   . Lack of Transportation (Medical): Not on file  . Lack of Transportation (Non-Medical): Not on file  Physical Activity:   . Days of Exercise per Week: Not on file  . Minutes of Exercise per Session: Not on file  Stress:   . Feeling of Stress : Not on file  Social Connections:   . Frequency of Communication with Friends and Family: Not on file  . Frequency of Social Gatherings with Friends and Family: Not on file  . Attends Religious Services: Not on file  . Active Member of Clubs or Organizations: Not on file  . Attends Archivist Meetings: Not on file  . Marital Status: Not on file   Allergies  Allergen Reactions  . Amoxicillin    Family History  Problem Relation Age of Onset  . Asthma Mother   . Hyperlipidemia Mother   . Cancer Maternal Grandmother   . Hypertension Maternal Grandmother   . Kidney disease Maternal Grandmother   . Cancer Maternal Grandfather   . Early death Paternal Grandmother   . Diabetes Paternal Grandfather   . Arthritis Paternal Grandfather   . Hypertension Paternal Grandfather     Current Outpatient Medications (Endocrine & Metabolic):  .  levonorgestrel (MIRENA, 52 MG,) 20 MCG/24HR IUD, Mirena 20 mcg/24 hr (5 years)  intrauterine device  Take 1 device by intrauterine route.   Current Outpatient Medications (Respiratory):  .  loratadine (CLARITIN) 10 MG tablet, Take 1 tablet (10 mg total) by mouth daily.    Current Outpatient Medications (Other):  Marland Kitchen  Cholecalciferol (VITAMIN D3) 2000 units capsule, Take 1 capsule (2,000 Units total) by mouth daily. .  Diclofenac Sodium (PENNSAID) 2 % SOLN, Place 2 g onto the skin 2 (two) times daily. .  pantoprazole (PROTONIX) 40 MG tablet, TAKE 1 TABLET BY MOUTH TWICE A DAY .  polyethylene glycol powder (GLYCOLAX/MIRALAX) powder, Take 17 g by mouth 2 (two) times daily as needed for moderate constipation. .  hyoscyamine (LEVSIN, ANASPAZ) 0.125  MG tablet, Take 1-2 tablets (0.125-0.25 mg total) by mouth every 4 (four) hours as needed for up to 10 days for cramping.   Reviewed prior external information including notes and imaging from  primary care provider As well as notes that were available from care everywhere and other healthcare systems.  Past medical history, social, surgical and family history all reviewed in electronic medical record.  [name] pertanent information unless stated regarding to the chief complaint.   Rev visual changes, nausea, vomiting, diarrhea, constipation, dizziness, abdominal pain, skin rash, fevers, chills, night sweats, weight loss, swollen lymph nodes, body aches, joint swelling, chest pain, shortness of breath, mood changes. POSITIVE muscle aches, headaches  Objective  Blood pressure 102/78, pulse 76, height 5\' 5"  (1.651 m), weight 157 lb (71.2 kg), SpO2 97 %.   General: [name] apparent distress alert and oriented x3 mood and affect normal, dressed appropriately.  HEENT: Pupils equal, extraocular movements intact  Respiratory: Patient's speak in full sentences and does not appear short of breath  Cardiovascular: [name] lower extremity edema, non tender, [name] erythema  Skin: Warm dry intact with [name] signs of infection or rash on extremities or on axial skeleton.  Abdomen: Soft nontender  Neuro: Cranial nerves II through XII are intact, neurovascularly intact in all extremities with 2+ DTRs and 2+ pulses.  Lymph: [name] lymphadenopathy of posterior or anterior cervical chain or axillae bilaterally.  Gait normal with good balance and coordination.  MSK:  Non tender with full range of motion and good stability and symmetric strength and tone of shoulders, elbows, wrist, hip, knee and ankles bilaterally.  Back Exam:  Inspection: Mild loss of lordosis Motion: Flexion 45 deg, Extension 25 deg, Side Bending to 35 deg bilaterally,  Rotation to 45 deg bilaterally  SLR laying: Negative  XSLR laying: Negative  Palpable  tenderness: Tender to palpation over the thoracolumbar juncture and mild over the right sacroiliac joint. FABER: Positive Faber on the right. Sensory change: Gross sensation intact to all lumbar and sacral dermatomes.  Reflexes: 2+ at both patellar tendons, 2+ at achilles tendons, Babinski's downgoing.  Strength at foot  Plantar-flexion: 5/5 Dorsi-flexion: 5/5 Eversion: 5/5 Inversion: 5/5  Leg strength  Quad: 5/5 Hamstring: 5/5 Hip flexor: 5/5 Hip abductors: 5/5  Gait unremarkable.  Osteopathic findings C2 flexed rotated and side bent right C6 flexed rotated and side bent left T3 extended rotated and side bent right inhaled third rib T6 extended rotated and side bent left L2 flexed rotated and side bent right Sacrum right on right    Impression and Recommendations:     This case required medical decision making of moderate complexity. The above documentation has been reviewed and is accurate and complete Dawn Pulley, DO       Note: This dictation was prepared with Dragon dictation along  with smaller phrase technology. Any transcriptional errors that result from this process are unintentional.

## 2019-03-22 NOTE — Patient Instructions (Signed)
Two tennis balls in a tube sock Keep stretching hip flexors See me in 6-8 weeks

## 2019-03-22 NOTE — Assessment & Plan Note (Signed)
Decision today to treat with OMT was based on Physical Exam  After verbal consent patient was treated with HVLA, ME, FPR techniques in cervical, thoracic, rib,  lumbar and sacral areas  Patient tolerated the procedure well with improvement in symptoms  Patient given exercises, stretches and lifestyle modifications  See medications in patient instructions if given  Patient will follow up in 4-8 weeks 

## 2019-05-18 ENCOUNTER — Other Ambulatory Visit: Payer: Self-pay

## 2019-05-18 ENCOUNTER — Ambulatory Visit: Payer: 59 | Admitting: Family Medicine

## 2019-05-18 ENCOUNTER — Encounter: Payer: Self-pay | Admitting: Family Medicine

## 2019-05-18 VITALS — BP 100/62 | HR 63 | Ht 65.0 in | Wt 158.0 lb

## 2019-05-18 DIAGNOSIS — M545 Low back pain: Secondary | ICD-10-CM

## 2019-05-18 DIAGNOSIS — G8929 Other chronic pain: Secondary | ICD-10-CM

## 2019-05-18 DIAGNOSIS — M999 Biomechanical lesion, unspecified: Secondary | ICD-10-CM

## 2019-05-18 NOTE — Assessment & Plan Note (Signed)

## 2019-05-18 NOTE — Patient Instructions (Addendum)
Good to see you Vitamin c with your other pill 250 mg See me again in 2 months

## 2019-05-18 NOTE — Progress Notes (Signed)
Dawn [name] [address] [place] Phone: [phone] Subjective:   I Dawn [name] am serving as a Education administrator for Dr. Hulan Saas.  This visit occurred during the SARS-CoV-2 public health emergency.  Safety protocols were in place, including screening questions prior to the visit, additional usage of staff PPE, and extensive cleaning of exam room while observing appropriate contact time as indicated for disinfecting solutions.   I'm seeing this patient by the request  of:  Plotnikov, [name], MD  CC: Low back pain follow-up  [id number]  Dawn [name] is a 49 y.o. female coming in with complaint of back pain. Last seen on 03/22/2019 for OMT. Patient states she is doing well overall. Lower back is stiff every morning.  Patient states no limitations in the midsternum throughout the day.  Been very active, doing multiple things such as home exercise, icing regimen, which activities to do which wants to avoid.  Denies any radiation to any of the extremities.     Past Medical History:  Diagnosis Date  . Allergy   . Frequent headaches   . GERD (gastroesophageal reflux disease)   . H/O sinus surgery    No past surgical history on file. Social History   Socioeconomic History  . Marital status: Married    Spouse name: Not on file  . Number of children: 3  . Years of education: Not on file  . Highest education level: Master's degree (e.g., MA, MS, MEng, MEd, MSW, MBA)  Occupational History  . Not on file  Tobacco Use  . Smoking status: Never Smoker  . Smokeless tobacco: Never Used  Substance and Sexual Activity  . Alcohol use: Yes  . Drug use: No  . Sexual activity: Yes    Birth control/protection: I.U.D.  Other Topics Concern  . Not on file  Social History Narrative  . Not on file   Social Determinants of Health   Financial Resource Strain:   . Difficulty of Paying Living Expenses:   Food Insecurity:   . Worried About  Charity fundraiser in the Last Year:   . Arboriculturist in the Last Year:   Transportation Needs:   . Film/video editor (Medical):   Marland Kitchen Lack of Transportation (Non-Medical):   Physical Activity:   . Days of Exercise per Week:   . Minutes of Exercise per Session:   Stress:   . Feeling of Stress :   Social Connections:   . Frequency of Communication with Friends and Family:   . Frequency of Social Gatherings with Friends and Family:   . Attends Religious Services:   . Active Member of Clubs or Organizations:   . Attends Archivist Meetings:   Marland Kitchen Marital Status:    Allergies  Allergen Reactions  . Amoxicillin    Family History  Problem Relation Age of Onset  . Asthma Mother   . Hyperlipidemia Mother   . Cancer Maternal Grandmother   . Hypertension Maternal Grandmother   . Kidney disease Maternal Grandmother   . Cancer Maternal Grandfather   . Early death Paternal Grandmother   . Diabetes Paternal Grandfather   . Arthritis Paternal Grandfather   . Hypertension Paternal Grandfather     Current Outpatient Medications (Endocrine & Metabolic):  .  levonorgestrel (MIRENA, 52 MG,) 20 MCG/24HR IUD, Mirena 20 mcg/24 hr (5 years) intrauterine device  Take 1 device by intrauterine route.   Current Outpatient Medications (Respiratory):  .  loratadine (CLARITIN) 10 MG tablet, Take 1 tablet (10 mg total) by mouth daily.    Current Outpatient Medications (Other):  Marland Kitchen  Cholecalciferol (VITAMIN D3) 2000 units capsule, Take 1 capsule (2,000 Units total) by mouth daily. .  Diclofenac Sodium (PENNSAID) 2 % SOLN, Place 2 g onto the skin 2 (two) times daily. .  pantoprazole (PROTONIX) 40 MG tablet, TAKE 1 TABLET BY MOUTH TWICE A DAY .  polyethylene glycol powder (GLYCOLAX/MIRALAX) powder, Take 17 g by mouth 2 (two) times daily as needed for moderate constipation. .  hyoscyamine (LEVSIN, ANASPAZ) 0.125 MG tablet, Take 1-2 tablets (0.125-0.25 mg total) by mouth every 4 (four)  hours as needed for up to 10 days for cramping.   Reviewed prior external information including notes and imaging from  primary care provider As well as notes that were available from care everywhere and other healthcare systems.  Past medical history, social, surgical and family history all reviewed in electronic medical record.  No pertanent information unless stated regarding to the chief complaint.   Review of Systems:  No headache, visual changes, nausea, vomiting, diarrhea, constipation, dizziness, abdominal pain, skin rash, fevers, chills, night sweats, weight loss, swollen lymph nodes, body aches, joint swelling, chest pain, shortness of breath, mood changes. POSITIVE muscle aches  Objective  Blood pressure 100/62, pulse 63, height 5\' 5"  (1.651 m), weight 158 lb (71.7 kg), SpO2 97 %.   General: No apparent distress alert and oriented x3 mood and affect normal, dressed appropriately.  HEENT: Pupils equal, extraocular movements intact  Respiratory: Patient's speak in full sentences and does not appear short of breath  Cardiovascular: No lower extremity edema, non tender, no erythema  Neuro: Cranial nerves II through XII are intact, neurovascularly intact in all extremities with 2+ DTRs and 2+ pulses.  Gait normal with good balance and coordination.  MSK:  Non tender with full range of motion and good stability and symmetric strength and tone of shoulders, elbows, wrist, hip, knee and ankles bilaterally.  Low back exam does have some mild loss of lordosis.  Mild positive Corky Sox right greater than left.  Patient does have some mild tightness on the contralateral side though as well.  Negative straight leg test.  Minimal tenderness to palpation diffusely in the lumbar paraspinal musculature.  Near full range of motion no otherwise.  Osteopathic findings C2 flexed rotated and side bent right T3 extended rotated and side bent right inhaled third rib T9 extended rotated and side bent  left L2 flexed rotated and side bent right Sacrum right on right    Impression and Recommendations:     This case required medical decision making of moderate complexity. The above documentation has been reviewed and is accurate and complete Dawn Pulley, DO       Note: This dictation was prepared with Dragon dictation along with smaller phrase technology. Any transcriptional errors that result from this process are unintentional.

## 2019-05-18 NOTE — Assessment & Plan Note (Signed)
Chronic problem, mild exacerbation, noted some mild increase in tightness recently.  Discussed nonmedication management including topical anti-inflammatories.  Has been prescribed for previously.  Discussed icing regimen and home exercise, which activities to do which wants to avoid.  Patient will increase activity slowly over the course the next several weeks and follow-up with me again 4 to 8 weeks.

## 2019-05-24 ENCOUNTER — Other Ambulatory Visit: Payer: Self-pay

## 2019-05-24 ENCOUNTER — Ambulatory Visit (INDEPENDENT_AMBULATORY_CARE_PROVIDER_SITE_OTHER): Payer: 59 | Admitting: Internal Medicine

## 2019-05-24 ENCOUNTER — Encounter: Payer: Self-pay | Admitting: Internal Medicine

## 2019-05-24 DIAGNOSIS — E039 Hypothyroidism, unspecified: Secondary | ICD-10-CM | POA: Diagnosis not present

## 2019-05-24 DIAGNOSIS — Z0000 Encounter for general adult medical examination without abnormal findings: Secondary | ICD-10-CM | POA: Diagnosis not present

## 2019-05-24 DIAGNOSIS — R635 Abnormal weight gain: Secondary | ICD-10-CM | POA: Diagnosis not present

## 2019-05-24 DIAGNOSIS — E559 Vitamin D deficiency, unspecified: Secondary | ICD-10-CM

## 2019-05-24 DIAGNOSIS — R609 Edema, unspecified: Secondary | ICD-10-CM

## 2019-05-24 DIAGNOSIS — K5900 Constipation, unspecified: Secondary | ICD-10-CM

## 2019-05-24 LAB — URINALYSIS
Hgb urine dipstick: NEGATIVE
Ketones, ur: 15 — AB
Leukocytes,Ua: NEGATIVE
Nitrite: NEGATIVE
Specific Gravity, Urine: >=1.03 — AB (ref 1.000–1.030)
Total Protein, Urine: NEGATIVE
Urine Glucose: NEGATIVE
Urobilinogen, UA: 0.2 (ref 0.0–1.0)
pH: 5.5 (ref 5.0–8.0)

## 2019-05-24 LAB — CBC WITH DIFFERENTIAL/PLATELET
Basophils Absolute: 0 10*3/uL (ref 0.0–0.1)
Basophils Relative: 0.4 % (ref 0.0–3.0)
Eosinophils Absolute: 0.2 10*3/uL (ref 0.0–0.7)
Eosinophils Relative: 4.1 % (ref 0.0–5.0)
HCT: 41.4 % (ref 36.0–46.0)
Hemoglobin: 13.9 g/dL (ref 12.0–15.0)
Lymphocytes Relative: 39.8 % (ref 12.0–46.0)
Lymphs Abs: 1.9 10*3/uL (ref 0.7–4.0)
MCHC: 33.4 g/dL (ref 30.0–36.0)
MCV: 90.2 fl (ref 78.0–100.0)
Monocytes Absolute: 0.3 10*3/uL (ref 0.1–1.0)
Monocytes Relative: 6.9 % (ref 3.0–12.0)
Neutro Abs: 2.4 10*3/uL (ref 1.4–7.7)
Neutrophils Relative %: 48.8 % (ref 43.0–77.0)
Platelets: 258 10*3/uL (ref 150.0–400.0)
RBC: 4.59 Mil/uL (ref 3.87–5.11)
RDW: 13.9 % (ref 11.5–15.5)
WBC: 4.9 10*3/uL (ref 4.0–10.5)

## 2019-05-24 LAB — HEPATIC FUNCTION PANEL
ALT: 21 U/L (ref 0–35)
AST: 23 U/L (ref 0–37)
Albumin: 4.5 g/dL (ref 3.5–5.2)
Alkaline Phosphatase: 62 U/L (ref 39–117)
Bilirubin, Direct: 0.1 mg/dL (ref 0.0–0.3)
Total Bilirubin: 0.5 mg/dL (ref 0.2–1.2)
Total Protein: 7.4 g/dL (ref 6.0–8.3)

## 2019-05-24 LAB — BASIC METABOLIC PANEL
BUN: 9 mg/dL (ref 6–23)
CO2: 28 mEq/L (ref 19–32)
Calcium: 9.2 mg/dL (ref 8.4–10.5)
Chloride: 102 mEq/L (ref 96–112)
Creatinine, Ser: 0.71 mg/dL (ref 0.40–1.20)
GFR: 88.06 mL/min (ref >60.00)
Glucose, Bld: 94 mg/dL (ref 70–99)
Potassium: 3.8 mEq/L (ref 3.5–5.1)
Sodium: 138 mEq/L (ref 135–145)

## 2019-05-24 LAB — VITAMIN D 25 HYDROXY (VIT D DEFICIENCY, FRACTURES): VITD: 38.55 ng/mL (ref 30.00–100.00)

## 2019-05-24 LAB — LIPID PANEL
Cholesterol: 256 mg/dL — ABNORMAL HIGH (ref 0–200)
HDL: 75.4 mg/dL (ref >39.00)
LDL Cholesterol: 161 mg/dL — ABNORMAL HIGH (ref 0–99)
NonHDL: 180.54
Total CHOL/HDL Ratio: 3
Triglycerides: 99 mg/dL (ref 0.0–149.0)
VLDL: 19.8 mg/dL (ref 0.0–40.0)

## 2019-05-24 LAB — TSH: TSH: 2.74 u[IU]/mL (ref 0.35–4.50)

## 2019-05-24 MED ORDER — FUROSEMIDE 20 MG PO TABS: 20.0000 mg | ORAL_TABLET | Freq: Every day | ORAL | 2 refills | Status: DC | PRN

## 2019-05-24 NOTE — Assessment & Plan Note (Signed)

## 2019-05-24 NOTE — Assessment & Plan Note (Signed)
Vit D 

## 2019-05-24 NOTE — Assessment & Plan Note (Signed)
On Progesterone

## 2019-05-24 NOTE — Assessment & Plan Note (Signed)
Better on gluten free diet 

## 2019-05-24 NOTE — Assessment & Plan Note (Signed)
Labs ?progesteronre related Furosemide prn

## 2019-05-24 NOTE — Progress Notes (Signed)
Subjective:  Patient ID: Dawn [name], female    DOB: Dec 30, 1970  Age: 49 y.o. MRN: [id number]  CC: No chief complaint on file.   HPI Dawn [name] presents for a well exam Playing tennis a lot C/o feet, hands occ edema  Outpatient Medications Prior to Visit  Medication Sig Dispense Refill  . Cholecalciferol (VITAMIN D3) 2000 units capsule Take 1 capsule (2,000 Units total) by mouth daily. 100 capsule 3  . loratadine (CLARITIN) 10 MG tablet Take 1 tablet (10 mg total) by mouth daily. 90 tablet 3  . polyethylene glycol powder (GLYCOLAX/MIRALAX) powder Take 17 g by mouth 2 (two) times daily as needed for moderate constipation. 500 g 11  . progesterone (PROMETRIUM) 200 MG capsule progesterone micronized 200 mg capsule  Take 1 capsule by mouth at bedtime for 12 days starting on Day #16 of each cycle    . Diclofenac Sodium (PENNSAID) 2 % SOLN Place 2 g onto the skin 2 (two) times daily. 112 g 3  . hyoscyamine (LEVSIN, ANASPAZ) 0.125 MG tablet Take 1-2 tablets (0.125-0.25 mg total) by mouth every 4 (four) hours as needed for up to 10 days for cramping. 100 tablet 3  . levonorgestrel (MIRENA, 52 MG,) 20 MCG/24HR IUD Mirena 20 mcg/24 hr (5 years) intrauterine device  Take 1 device by intrauterine route.    . pantoprazole (PROTONIX) 40 MG tablet TAKE 1 TABLET BY MOUTH TWICE A DAY 60 tablet 1   No facility-administered medications prior to visit.    ROS: Review of Systems  Constitutional: Negative for activity change, appetite change, chills, fatigue and unexpected weight change.  HENT: Negative for congestion, mouth sores and sinus pressure.   Eyes: Negative for visual disturbance.  Respiratory: Negative for cough and chest tightness.   Gastrointestinal: Negative for abdominal pain and nausea.  Genitourinary: Negative for difficulty urinating, frequency and vaginal pain.  Musculoskeletal: Negative for back pain and gait problem.  Skin: Negative for pallor and rash.    Neurological: Negative for dizziness, tremors, weakness, numbness and headaches.  Psychiatric/Behavioral: Negative for confusion, sleep disturbance and suicidal ideas.    Objective:  BP 112/76 (BP Location: Left Arm, Patient Position: Sitting, Cuff Size: Normal)   Pulse 63   Temp 98 F (36.7 C) (Oral)   Ht 5\' 5"  (1.651 m)   Wt 152 lb (68.9 kg)   SpO2 98%   BMI 25.29 kg/m   BP Readings from Last 3 Encounters:  05/24/19 112/76  05/18/19 100/62  03/22/19 102/78    Wt Readings from Last 3 Encounters:  05/24/19 152 lb (68.9 kg)  05/18/19 158 lb (71.7 kg)  03/22/19 157 lb (71.2 kg)    Physical Exam Constitutional:      General: She is not in acute distress.    Appearance: She is well-developed.  HENT:     Head: Normocephalic.     Right Ear: External ear normal.     Left Ear: External ear normal.     Nose: Nose normal.  Eyes:     General:        Right eye: No discharge.        Left eye: No discharge.     Conjunctiva/sclera: Conjunctivae normal.     Pupils: Pupils are equal, round, and reactive to light.  Neck:     Thyroid: No thyromegaly.     Vascular: No JVD.     Trachea: No tracheal deviation.  Cardiovascular:     Rate and Rhythm: Normal rate and regular rhythm.  Heart sounds: Normal heart sounds.  Pulmonary:     Effort: No respiratory distress.     Breath sounds: No stridor. No wheezing.  Abdominal:     General: Bowel sounds are normal. There is no distension.     Palpations: Abdomen is soft. There is no mass.     Tenderness: There is no abdominal tenderness. There is no guarding or rebound.  Musculoskeletal:        General: No tenderness.     Cervical back: Normal range of motion and neck supple.  Lymphadenopathy:     Cervical: No cervical adenopathy.  Skin:    Findings: No erythema or rash.  Neurological:     Mental Status: She is oriented to person, place, and time.     Cranial Nerves: No cranial nerve deficit.     Motor: No abnormal muscle tone.      Coordination: Coordination normal.     Deep Tendon Reflexes: Reflexes normal.  Psychiatric:        Behavior: Behavior normal.        Thought Content: Thought content normal.        Judgment: Judgment normal.     Lab Results  Component Value Date   WBC 7.6 03/17/2017   HGB 14.0 03/17/2017   HCT 41.9 03/17/2017   PLT 242.0 03/17/2017   GLUCOSE 96 03/17/2017   CHOL 158 03/17/2017   TRIG 67.0 03/17/2017   HDL 51.80 03/17/2017   LDLCALC 93 03/17/2017   ALT 11 03/17/2017   AST 19 03/17/2017   NA 139 03/17/2017   K 4.1 03/17/2017   CL 103 03/17/2017   CREATININE 0.76 03/17/2017   BUN 10 03/17/2017   CO2 26 03/17/2017   TSH 3.37 03/17/2017    DG Lumbar Spine Complete  Result Date: 09/06/2017 CLINICAL DATA:  Lumbago EXAM: LUMBAR SPINE - COMPLETE 4+ VIEW COMPARISON:  None. FINDINGS: Frontal, lateral, spot lumbosacral lateral, and bilateral oblique views were obtained. There are 5 non-rib-bearing lumbar type vertebral bodies. There is no fracture or spondylolisthesis. There is mild disc space narrowing at L4-5 and L5-S1. Other disc spaces appear unremarkable. There is no appreciable facet arthropathy. IMPRESSION: Slight disc space narrowing at L4-5 and L5-S1. No appreciable facet arthropathy. No fracture or spondylolisthesis. Electronically Signed   By: Lowella Grip III M.D.   On: 09/06/2017 10:36   DG Knee Bilateral Standing AP  Result Date: 09/06/2017 CLINICAL DATA:  Bilateral knee pain right greater than left EXAM: BILATERAL KNEES STANDING - 1 VIEW COMPARISON:  None. FINDINGS: Very mild medial joint space narrowing is noted bilaterally. No acute fracture or dislocation is noted. No soft tissue changes are seen. IMPRESSION: Minimal degenerative change. Electronically Signed   By: Inez Catalina M.D.   On: 09/06/2017 10:36   [place] LIMITED JOINT SPACE STRUCTURES UP RIGHT  Result Date: 09/08/2017 [facility] US performed of: Right knee This study was ordered, performed, and interpreted by Charlann Boxer D.O. Knee: Right knee pain shows trace effusion noted. Patient does have some mild patellofemoral narrowing. Medial and lateral joint space looks fairly unremarkable.   Patellofemoral    Assessment & Plan:    Walker Kehr, MD

## 2019-05-24 NOTE — Assessment & Plan Note (Signed)
FT4, TSH 

## 2019-07-06 ENCOUNTER — Other Ambulatory Visit: Payer: Self-pay | Admitting: Internal Medicine

## 2019-07-17 ENCOUNTER — Other Ambulatory Visit: Payer: Self-pay

## 2019-07-17 MED ORDER — FUROSEMIDE 20 MG PO TABS: 20.0000 mg | ORAL_TABLET | Freq: Every day | ORAL | 0 refills | Status: DC | PRN

## 2019-07-18 ENCOUNTER — Ambulatory Visit: Payer: 59 | Admitting: Family Medicine

## 2019-07-18 ENCOUNTER — Other Ambulatory Visit: Payer: Self-pay

## 2019-07-18 ENCOUNTER — Encounter: Payer: Self-pay | Admitting: Family Medicine

## 2019-07-18 VITALS — BP 100/72 | HR 68 | Ht 65.0 in | Wt 154.0 lb

## 2019-07-18 DIAGNOSIS — G8929 Other chronic pain: Secondary | ICD-10-CM | POA: Diagnosis not present

## 2019-07-18 DIAGNOSIS — M999 Biomechanical lesion, unspecified: Secondary | ICD-10-CM | POA: Diagnosis not present

## 2019-07-18 DIAGNOSIS — M545 Low back pain: Secondary | ICD-10-CM

## 2019-07-18 MED ORDER — METHYLPREDNISOLONE ACETATE 80 MG/ML IJ SUSP
80.0000 mg | Freq: Once | INTRAMUSCULAR | Status: AC
  Administered 2019-07-18: 80 mg via INTRAMUSCULAR

## 2019-07-18 MED ORDER — KETOROLAC TROMETHAMINE 60 MG/2ML IM SOLN
60.0000 mg | Freq: Once | INTRAMUSCULAR | Status: AC
  Administered 2019-07-18: 60 mg via INTRAMUSCULAR

## 2019-07-18 MED ORDER — PREDNISONE 50 MG PO TABS: ORAL_TABLET | ORAL | 0 refills | Status: DC

## 2019-07-18 NOTE — Progress Notes (Signed)
[place] [address] [place] Phone: [phone] Subjective:   I Dawn [name] am serving as a Education administrator for Dr. Hulan Saas.  This visit occurred during the SARS-CoV-2 public health emergency.  Safety protocols were in place, including screening questions prior to the visit, additional usage of staff PPE, and extensive cleaning of exam room while observing appropriate contact time as indicated for disinfecting solutions.   I'm seeing this patient by the request  of:  Plotnikov, [name], MD  CC: Low back pain follow-up  [id number]  Dawn [name] is a 49 y.o. female coming in with complaint of back and neck pain. OMT 05/18/2019. Patient states she was deep cleaning about a week ago when she felt left sided low back pain. Sciatic nerve pain. Pain increases with sitting, walking and standing. Mostly the transition between these positions are painful.   Medications patient has been prescribed: Gabapentin which patient was unable to tolerate          Reviewed prior external information including notes and imaging from previsou exam, outside providers and external EMR if available.   As well as notes that were available from care everywhere and other healthcare systems.  Past medical history, social, surgical and family history all reviewed in electronic medical record.  No pertanent information unless stated regarding to the chief complaint.   Past Medical History:  Diagnosis Date   Allergy    Frequent headaches    GERD (gastroesophageal reflux disease)    H/O sinus surgery     Allergies  Allergen Reactions   Amoxicillin      Review of Systems:  No headache, visual changes, nausea, vomiting, diarrhea, constipation, dizziness, abdominal pain, skin rash, fevers, chills, night sweats, weight loss, swollen lymph nodes, body aches, joint swelling, chest pain, shortness of breath, mood changes. POSITIVE muscle  aches  Objective  Blood pressure 100/72, pulse 68, height 5\' 5"  (1.651 m), weight 154 lb (69.9 kg), SpO2 96 %.   General: No apparent distress alert and oriented x3 mood and affect normal, dressed appropriately.  HEENT: Pupils equal, extraocular movements intact  Respiratory: Patient's speak in full sentences and does not appear short of breath  Cardiovascular: No lower extremity edema, non tender, no erythema  Neuro: Cranial nerves II through XII are intact, neurovascularly intact in all extremities with 2+ DTRs and 2+ pulses.  Gait normal with good balance and coordination.  MSK:  Non tender with full range of motion and good stability and symmetric strength and tone of shoulders, elbows, wrist, hip, knee and ankles bilaterally.  Back - \low back exam shows the patient has severe tenderness to palpation over the left sacroiliac joint.  Patient states that any type of flexion seems to hurt.  Severe increased tone Faber on the right side of the sacroiliac joint.  Negative straight leg test.  Patient has some mild tightness in the parascapular region.  Significant amount of voluntary guarding noted today.  Osteopathic findings  C2 flexed rotated and side bent right C6 flexed rotated and side bent left T3 extended rotated and side bent right inhaled rib T9 extended rotated and side bent left L2 flexed rotated and side bent right Sacrum right on right       Assessment and Plan:    Nonallopathic problems  Decision today to treat with OMT was based on Physical Exam  After verbal consent patient was treated with HVLA, ME, FPR techniques in cervical, rib, thoracic, lumbar, and  sacral  areas  Patient tolerated the procedure well with improvement in symptoms  Patient given exercises, stretches and lifestyle modifications  See medications in patient instructions if given  Patient will follow up in 4-8 weeks      The above documentation has been reviewed and is accurate and  complete [name], DO       Note: This dictation was prepared with Dragon dictation along with smaller phrase technology. Any transcriptional errors that result from this process are unintentional.

## 2019-07-18 NOTE — Assessment & Plan Note (Signed)
Chronic problem with exacerbation.  Prednisone given today.  Seems to be more sacroiliac joint in nature.  Toradol and Depo-Medrol given as well to follow-up again in 4 to 8 weeks

## 2019-07-18 NOTE — Patient Instructions (Addendum)
Good to see you Prednisone for 5 days starting tomorrow if not better Continue exercises  See me again in 3-4 weeks

## 2019-08-16 ENCOUNTER — Encounter: Payer: Self-pay | Admitting: Family Medicine

## 2019-08-16 ENCOUNTER — Ambulatory Visit (INDEPENDENT_AMBULATORY_CARE_PROVIDER_SITE_OTHER): Payer: 59 | Admitting: Family Medicine

## 2019-08-16 ENCOUNTER — Other Ambulatory Visit: Payer: Self-pay

## 2019-08-16 VITALS — BP 112/72 | HR 67 | Ht 65.0 in | Wt 152.0 lb

## 2019-08-16 DIAGNOSIS — M545 Low back pain: Secondary | ICD-10-CM | POA: Diagnosis not present

## 2019-08-16 DIAGNOSIS — G8929 Other chronic pain: Secondary | ICD-10-CM | POA: Diagnosis not present

## 2019-08-16 DIAGNOSIS — M999 Biomechanical lesion, unspecified: Secondary | ICD-10-CM | POA: Diagnosis not present

## 2019-08-16 NOTE — Assessment & Plan Note (Signed)
Stable overall.  Discussed with patient on core strengthening.  Which activities to doing which wants to avoid.  Patient will increase her activity slowly.  Discussed icing regimen and home exercises, increase activity slowly.  Follow-up again in 4 to 8 weeks

## 2019-08-16 NOTE — Patient Instructions (Signed)
See me again in 6-8 weeks ?

## 2019-08-16 NOTE — Progress Notes (Signed)
[place] Phone: [phone] Subjective:   Dawn [name], am serving as a scribe for Dr. Hulan Saas. This visit occurred during the SARS-CoV-2 public health emergency.  Safety protocols were in place, including screening questions prior to the visit, additional usage of staff PPE, and extensive cleaning of exam room while observing appropriate contact time as indicated for disinfecting solutions.   I'm seeing this patient by the request  of:  Plotnikov, [name], MD  CC: Back and neck pain follow-up  [id number]  Dawn [name] is a 49 y.o. female coming in with complaint of back and neck pain. OMT 07/18/2019. Patient states that she has intermittent lower back pain. Patient notes on her bad days that her entire body is swollen.   Medications patient has been prescribed: Only vitamins at this moment          Reviewed prior external information including notes and imaging from previsou exam, outside providers and external EMR if available.   As well as notes that were available from care everywhere and other healthcare systems.  Past medical history, social, surgical and family history all reviewed in electronic medical record.  [name] pertanent information unless stated regarding to the chief complaint.   Past Medical History:  Diagnosis Date  . Allergy   . Frequent headaches   . GERD (gastroesophageal reflux disease)   . H/O sinus surgery     Allergies  Allergen Reactions  . Amoxicillin      Review of Systems:  [name] headache, visual changes, nausea, vomiting, diarrhea, constipation, dizziness, abdominal pain, skin rash, fevers, chills, night sweats, weight loss, swollen lymph nodes, body aches, joint swelling, chest pain, shortness of breath, mood changes. POSITIVE muscle aches  Objective  Blood pressure 112/72, pulse 67, height 5\' 5"  (1.651 m), weight 152 lb (68.9 kg), SpO2 99 %.   General:  [name] apparent distress alert and oriented x3 mood and affect normal, dressed appropriately.  HEENT: Pupils equal, extraocular movements intact  Respiratory: Patient's speak in full sentences and does not appear short of breath  Cardiovascular: [name] lower extremity edema, non tender, [name] erythema  Neuro: Cranial nerves II through XII are intact, neurovascularly intact in all extremities with 2+ DTRs and 2+ pulses.  Gait normal with good balance and coordination.  MSK:  Non tender with full range of motion and good stability and symmetric strength and tone of shoulders, elbows, wrist, hip, knee and ankles bilaterally.  Back - Normal skin, Spine with normal alignment and [name] deformity.  [name] tenderness to vertebral process palpation.  Paraspinous muscles are not tender and without spasm.   Range of motion is full at neck and lumbar sacral regions  Osteopathic findings  C2 flexed rotated and side bent right C5 flexed rotated and side bent left T3 extended rotated and side bent right inhaled rib T7 extended rotated and side bent left L2 flexed rotated and side bent right Sacrum right on right       Assessment and Plan: Low back pain Stable overall.  Discussed with patient on core strengthening.  Which activities to doing which wants to avoid.  Patient will increase her activity slowly.  Discussed icing regimen and home exercises, increase activity slowly.  Follow-up again in 4 to 8 weeks     Nonallopathic problems  Decision today to treat with OMT was based on Physical Exam  After verbal consent patient was treated with HVLA, ME, FPR techniques in  cervical, rib, thoracic, lumbar, and sacral  areas  Patient tolerated the procedure well with improvement in symptoms  Patient given exercises, stretches and lifestyle modifications  See medications in patient instructions if given  Patient will follow up in 4-8 weeks      The above documentation has been reviewed and is accurate and  complete [name], DO       Note: This dictation was prepared with Dragon dictation along with smaller phrase technology. Any transcriptional errors that result from this process are unintentional.

## 2019-10-02 NOTE — Progress Notes (Signed)
  [place] Phone: [phone] Subjective:   [name], am serving as a scribe for Dr. Hulan Saas. This visit occurred during the SARS-CoV-2 public health emergency.  Safety protocols were in place, including screening questions prior to the visit, additional usage of staff PPE, and extensive cleaning of exam room while observing appropriate contact time as indicated for disinfecting solutions.   I'm seeing this patient by the request  of:  Plotnikov, [name], MD  CC: Low back pain follow-up  [id number]  Luanne Guin is a 49 y.o. female coming in with complaint of back and neck pain. OMT 08/16/2019. Patient states that she was cleaning 10 days ago and she woke up next morning and could not get out of bed due to pain. Pain did start to improve yesterday. Prescribe muscle relaxer by OB. Left side radiculopathy into left lateral malleolus into the foot. Great toe is numb. Pain over SI joint.    Medications patient has been prescribed: Patient was given a muscle relaxer by primary care provider which has been helpful .         Reviewed prior external information including notes and imaging from previsou exam, outside providers and external EMR if available.   As well as notes that were available from care everywhere and other healthcare systems.  Past medical history, social, surgical and family history all reviewed in electronic medical record.  No pertanent information unless stated regarding to the chief complaint.   Past Medical History:  Diagnosis Date  . Allergy   . Frequent headaches   . GERD (gastroesophageal reflux disease)   . H/O sinus surgery     Allergies  Allergen Reactions  . Amoxicillin      Review of Systems:  No headache, visual changes, nausea, vomiting, diarrhea, constipation, dizziness, abdominal pain, skin rash, fevers, chills, night sweats, weight loss, swollen lymph  nodes, body aches, joint swelling, chest pain, shortness of breath, mood changes. POSITIVE muscle aches  Objective  Pulse (!) 115, height 5\' 5"  (1.651 m), weight 146 lb (66.2 kg), SpO2 97 %.   General: Patient appears to be very uncomfortable. HEENT: Pupils equal, extraocular movements intact  Respiratory: Patient's speak in full sentences and does not appear short of breath  Cardiovascular: No lower extremity edema, non tender, no erythema  Neuro: Cranial nerves II through XII are intact, neurovascularly intact in all extremities with 2+ DTRs and 2+ pulses.  Gait normal with good balance and coordination.  MSK:  Non tender with full range of motion and good stability and symmetric strength and tone of shoulders, elbows, wrist, hip, knee and ankles bilaterally.  Back -  Patient does have loss of lordosis.  Tenderness to palpation of the paraspinal musculature lumbar spine.  Positive straight leg test on the left side at 4 flexion of 25 degrees.     Assessment and Plan:        The above documentation has been reviewed and is accurate and complete [name], DO       Note: This dictation was prepared with Dragon dictation along with smaller phrase technology. Any transcriptional errors that result from this process are unintentional.

## 2019-10-03 ENCOUNTER — Ambulatory Visit (INDEPENDENT_AMBULATORY_CARE_PROVIDER_SITE_OTHER): Payer: 59 | Admitting: Family Medicine

## 2019-10-03 ENCOUNTER — Ambulatory Visit (INDEPENDENT_AMBULATORY_CARE_PROVIDER_SITE_OTHER): Payer: 59

## 2019-10-03 ENCOUNTER — Encounter: Payer: Self-pay | Admitting: Family Medicine

## 2019-10-03 ENCOUNTER — Other Ambulatory Visit: Payer: Self-pay

## 2019-10-03 VITALS — HR 115 | Ht 65.0 in | Wt 146.0 lb

## 2019-10-03 DIAGNOSIS — M5416 Radiculopathy, lumbar region: Secondary | ICD-10-CM

## 2019-10-03 DIAGNOSIS — M2550 Pain in unspecified joint: Secondary | ICD-10-CM | POA: Diagnosis not present

## 2019-10-03 MED ORDER — PREDNISONE 50 MG PO TABS: ORAL_TABLET | ORAL | 0 refills | Status: DC

## 2019-10-03 MED ORDER — METHYLPREDNISOLONE ACETATE 80 MG/ML IJ SUSP
80.0000 mg | Freq: Once | INTRAMUSCULAR | Status: AC
  Administered 2019-10-03: 80 mg via INTRAMUSCULAR

## 2019-10-03 MED ORDER — KETOROLAC TROMETHAMINE 60 MG/2ML IM SOLN
60.0000 mg | Freq: Once | INTRAMUSCULAR | Status: AC
  Administered 2019-10-03: 60 mg via INTRAMUSCULAR

## 2019-10-03 NOTE — Assessment & Plan Note (Signed)
Patient does have what appears to be more of a lumbar radiculopathy with left-sided weakness sciatic type concern.  Discussed with patient treatment, discussed home exercise, icing regimen, which activities to do which wants to avoid.  Patient will avoid tennis for right now following up again in 1 to 2 weeks.  Patient's x-rays previously 2 years ago that were independently visualized by me showed that patient did have some L4-L5 and L5-S1 degenerative disc disease.  Prednisone given for 5 days.  Follow-up with me again 1 to 2 weeks total time with patient including injections, and reviewing patient's imaging and notes on the date of service 32 minutes

## 2019-10-03 NOTE — Patient Instructions (Addendum)
Prednisone 50 mg for 5 days starting tomorrow See me in 1-2 weeks

## 2019-10-12 ENCOUNTER — Ambulatory Visit: Payer: 59 | Admitting: Family Medicine

## 2019-10-12 ENCOUNTER — Encounter: Payer: Self-pay | Admitting: Family Medicine

## 2019-10-12 ENCOUNTER — Other Ambulatory Visit: Payer: Self-pay

## 2019-10-12 VITALS — HR 97 | Ht 65.0 in | Wt 146.0 lb

## 2019-10-12 DIAGNOSIS — M5416 Radiculopathy, lumbar region: Secondary | ICD-10-CM | POA: Diagnosis not present

## 2019-10-12 MED ORDER — GABAPENTIN 100 MG PO CAPS: 200.0000 mg | ORAL_CAPSULE | Freq: Every day | ORAL | 3 refills | Status: DC

## 2019-10-12 NOTE — Patient Instructions (Addendum)
Gabapentin 200 mg MRI we will see what it says and go from there

## 2019-10-12 NOTE — Progress Notes (Signed)
[place] Phone: [phone] Subjective:   Dawn [name], am serving as a scribe for Dr. Hulan Saas. This visit occurred during the SARS-CoV-2 public health emergency.  Safety protocols were in place, including screening questions prior to the visit, additional usage of staff PPE, and extensive cleaning of exam room while observing appropriate contact time as indicated for disinfecting solutions.   I'm seeing this patient by the request  of:  Plotnikov, [name], MD  CC: Back and neck pain follow-up  [id number]  Dawn [name] is a 49 y.o. female coming in with complaint of back and neck pain. OMT 08/16/2019. Patient states that her back pain is constant and sharp. Patient also complains of pain in left groin. Feels a sharp pain with using the bathroom. Did have a bowel movement which helped pain in groin but it is still present when bowel movements. Pain radiates down into left calf. Great toe left foot is numb and is spread to top of foot and into 2nd toe. Sitting for prolonged periods increases her pain. Injections in back side helped with lower back pain. Pain is relieved when she is on her back and her legs are raised as is her head and neck. Did use prednisone for 5 days.             Reviewed prior external information including notes and imaging from previsou exam, outside providers and external EMR if available.   As well as notes that were available from care everywhere and other healthcare systems.  Past medical history, social, surgical and family history all reviewed in electronic medical record.  [name] pertanent information unless stated regarding to the chief complaint.   Past Medical History:  Diagnosis Date  . Allergy   . Frequent headaches   . GERD (gastroesophageal reflux disease)   . H/O sinus surgery     Allergies  Allergen Reactions  . Amoxicillin      Review of Systems:   [name] headache, visual changes, nausea, vomiting, diarrhea, constipation, dizziness, abdominal pain, skin rash, fevers, chills, night sweats, weight loss, swollen lymph nodes, body aches, joint swelling, chest pain, shortness of breath, mood changes. POSITIVE muscle aches  Objective  Pulse 97, height 5\' 5"  (1.651 m), weight 146 lb (66.2 kg), SpO2 98 %.   General: [name] apparent distress alert and oriented x3 mood and affect normal, dressed appropriately.  HEENT: Pupils equal, extraocular movements intact  Respiratory: Patient's speak in full sentences and does not appear short of breath  Cardiovascular: [name] lower extremity edema, non tender, [name] erythema  Neuro: Cranial nerves II through XII are intact, neurovascularly intact in all extremities with 2+ DTRs and 2+ pulses.  Gait normal with good balance and coordination.  MSK:   Back -patient is still very uncomfortable and is walking around the room.  Positive straight leg test on the left side at 20 degrees of forward flexion.  Mild weakness noted especially with dorsi flexion of the foot on the left side.  Radicular symptoms seem to be in the L4 and L5 distribution.  Deep tendon reflexes though are still intact.  Back pain is still diffuse to palpation in the lumbar region      Assessment and Plan:        The above documentation has been reviewed and is accurate and complete [name], DO       Note: This dictation was prepared with Dragon dictation along  with smaller phrase technology. Any transcriptional errors that result from this process are unintentional.

## 2019-10-12 NOTE — Assessment & Plan Note (Signed)
Patient does have some mild improvement in the pain but continues to have the radicular symptoms.  Positive straight leg test on the left side.  Patient does have some mild weakness of the lower extremities on the left compared to the right.  Deep tendon reflexes though are intact.  Patient though has pain that is not responding well to conservative therapy and actually some increasing weakness that I do feel advanced imaging is warranted for the back at this time.  MRI ordered and depending on findings this could change medical management.  Could be a candidate for for potential injections.

## 2019-10-27 ENCOUNTER — Other Ambulatory Visit: Payer: Self-pay

## 2019-10-27 ENCOUNTER — Ambulatory Visit
Admission: RE | Admit: 2019-10-27 | Discharge: 2019-10-27 | Disposition: A | Discharge status: Home / Self Care | Payer: 59 | Source: Ambulatory Visit | Attending: Family Medicine | Admitting: Family Medicine

## 2019-10-27 ENCOUNTER — Encounter: Payer: Self-pay | Admitting: Family Medicine

## 2019-10-27 DIAGNOSIS — M5416 Radiculopathy, lumbar region: Secondary | ICD-10-CM

## 2019-11-20 NOTE — Progress Notes (Signed)
[place] [address] [place] Phone: [phone] Subjective:   I [name] am serving as a Education administrator for Dr. Hulan Saas.  This visit occurred during the SARS-CoV-2 public health emergency.  Safety protocols were in place, including screening questions prior to the visit, additional usage of staff PPE, and extensive cleaning of exam room while observing appropriate contact time as indicated for disinfecting solutions.   I'm seeing this patient by the request  of:  Plotnikov, [name], MD  CC: Low back pain follow-up  [id number]   10/12/2019 Patient does have some mild improvement in the pain but continues to have the radicular symptoms.  Positive straight leg test on the left side.  Patient does have some mild weakness of the lower extremities on the left compared to the right.  Deep tendon reflexes though are intact.  Patient though has pain that is not responding well to conservative therapy and actually some increasing weakness that I do feel advanced imaging is warranted for the back at this time.  MRI ordered and depending on findings this could change medical management.  Could be a candidate for for potential injections.  UPDATE 11/20/2019 Mkenzie Belland is a 49 y.o. female coming in with complaint of low back pain. Patient states she is getting better. Sore at times. Ok with sitting but pain with sitting back in a chair. About 20% pain. Has been stretching and exercising. Has not started tennis yet.   MRI lumbar spine 10/27/2019  IMPRESSION: 1. Mild multilevel degenerative changes of the lumbar spine without high-grade spinal canal or neural foraminal stenosis at any level. 2. Mild narrowing of the left subarticular zone at L4-L5. 3. Small superiorly migrating central disc extrusion at L5-S1 without neural impingement.    Past Medical History:  Diagnosis Date  . Allergy   . Frequent headaches   . GERD  (gastroesophageal reflux disease)   . H/O sinus surgery    No past surgical history on file. Social History   Socioeconomic History  . Marital status: Married    Spouse name: Not on file  . Number of children: 3  . Years of education: Not on file  . Highest education level: Master's degree (e.g., MA, MS, MEng, MEd, MSW, MBA)  Occupational History  . Not on file  Tobacco Use  . Smoking status: Never Smoker  . Smokeless tobacco: Never Used  Substance and Sexual Activity  . Alcohol use: Yes  . Drug use: No  . Sexual activity: Yes    Birth control/protection: I.U.D.  Other Topics Concern  . Not on file  Social History Narrative  . Not on file   Social Determinants of Health   Financial Resource Strain:   . Difficulty of Paying Living Expenses: Not on file  Food Insecurity:   . Worried About Charity fundraiser in the Last Year: Not on file  . Ran Out of Food in the Last Year: Not on file  Transportation Needs:   . Lack of Transportation (Medical): Not on file  . Lack of Transportation (Non-Medical): Not on file  Physical Activity:   . Days of Exercise per Week: Not on file  . Minutes of Exercise per Session: Not on file  Stress:   . Feeling of Stress : Not on file  Social Connections:   . Frequency of Communication with Friends and Family: Not on file  . Frequency of Social Gatherings with Friends and Family: Not on file  .  Attends Religious Services: Not on file  . Active Member of Clubs or Organizations: Not on file  . Attends Archivist Meetings: Not on file  . Marital Status: Not on file   Allergies  Allergen Reactions  . Amoxicillin    Family History  Problem Relation Age of Onset  . Asthma Mother   . Hyperlipidemia Mother   . Cancer Maternal Grandmother   . Hypertension Maternal Grandmother   . Kidney disease Maternal Grandmother   . Cancer Maternal Grandfather   . Early death Paternal Grandmother   . Diabetes Paternal Grandfather   .  Arthritis Paternal Grandfather   . Hypertension Paternal Grandfather     Current Outpatient Medications (Endocrine & Metabolic):  .  progesterone (PROMETRIUM) 200 MG capsule, progesterone micronized 200 mg capsule  Take 1 capsule by mouth at bedtime for 12 days starting on Day #16 of each cycle  Current Outpatient Medications (Cardiovascular):  .  furosemide (LASIX) 20 MG tablet, Take 1 tablet (20 mg total) by mouth daily as needed.  Current Outpatient Medications (Respiratory):  .  loratadine (CLARITIN) 10 MG tablet, Take 1 tablet (10 mg total) by mouth daily.    Current Outpatient Medications (Other):  Marland Kitchen  Cholecalciferol (VITAMIN D3) 2000 units capsule, Take 1 capsule (2,000 Units total) by mouth daily. .  cyclobenzaprine (FLEXERIL) 10 MG tablet, Take 10 mg by mouth 3 (three) times daily as needed for muscle spasms. Marland Kitchen  gabapentin (NEURONTIN) 100 MG capsule, Take 2 capsules (200 mg total) by mouth at bedtime. .  polyethylene glycol powder (GLYCOLAX/MIRALAX) powder, Take 17 g by mouth 2 (two) times daily as needed for moderate constipation.   Reviewed prior external information including notes and imaging from  primary care provider As well as notes that were available from care everywhere and other healthcare systems.  Past medical history, social, surgical and family history all reviewed in electronic medical record.  No pertanent information unless stated regarding to the chief complaint.   Review of Systems:  No headache, visual changes, nausea, vomiting, diarrhea, constipation, dizziness, abdominal pain, skin rash, fevers, chills, night sweats, weight loss, swollen lymph nodes, body aches, joint swelling, chest pain, shortness of breath, mood changes. POSITIVE muscle aches  Objective  Blood pressure 100/78, pulse 78, height 5\' 5"  (1.651 m), weight 146 lb (66.2 kg), SpO2 97 %.   General: No apparent distress alert and oriented x3 mood and affect normal, dressed appropriately.    HEENT: Pupils equal, extraocular movements intact  Respiratory: Patient's speak in full sentences and does not appear short of breath  Cardiovascular: No lower extremity edema, non tender, no erythema  Neuro: Cranial nerves II through XII are intact, neurovascularly intact in all extremities with 2+ DTRs and 2+ pulses.  Gait normal with good balance and coordination.  MSK:  Non tender with full range of motion and good stability and symmetric strength and tone of shoulders, elbows, wrist, hip, knee and ankles bilaterally.  Low back exam still has some mild loss of lordosis.  Patient does not have any radicular symptoms with straight leg test but does have still tightness of the hamstring noted.  Patient does have mild tightness of the [facility] as well.  Osteopathic findings  C6 flexed rotated and side bent left T5 extended rotated and side bent right inhaled rib L2 flexed rotated and side bent right L5 flexed rotated and side bent left Sacrum right on right    Impression and Recommendations:     The above  documentation has been reviewed and is accurate and complete [name], DO

## 2019-11-21 ENCOUNTER — Ambulatory Visit: Payer: 59 | Admitting: Family Medicine

## 2019-11-21 ENCOUNTER — Other Ambulatory Visit: Payer: Self-pay

## 2019-11-21 ENCOUNTER — Encounter: Payer: Self-pay | Admitting: Family Medicine

## 2019-11-21 VITALS — BP 100/78 | HR 78 | Ht 65.0 in | Wt 146.0 lb

## 2019-11-21 DIAGNOSIS — M5416 Radiculopathy, lumbar region: Secondary | ICD-10-CM | POA: Diagnosis not present

## 2019-11-21 DIAGNOSIS — M999 Biomechanical lesion, unspecified: Secondary | ICD-10-CM | POA: Diagnosis not present

## 2019-11-21 NOTE — Assessment & Plan Note (Signed)
   Decision today to treat with OMT was based on Physical Exam  After verbal consent patient was treated with  ME, FPR techniques in cervical, thoracic, rib, lumbar and sacral areas, all areas are chronic   Patient tolerated the procedure well with improvement in symptoms  Patient given exercises, stretches and lifestyle modifications  See medications in patient instructions if given  Patient will follow up in 4-8 weeks 

## 2019-11-21 NOTE — Patient Instructions (Addendum)
Good to see you Continue gabapentin Can always consider epidural Ok to start tennis clinic next week See me again in 4-5 weeks

## 2019-11-21 NOTE — Assessment & Plan Note (Signed)
Patient is making some progress very slow.  Discussed with patient icing regimen, home exercise, which activities to do which wants to avoid.  Patient is increasing activity slowly.  Discussed with patient about the gabapentin still.  We discussed the potential for epidurals which patient will consider.  Follow-up with me again in 4 to 8 weeks

## 2019-12-25 NOTE — Progress Notes (Signed)
[place] [address] [place] Phone: [phone] Subjective:   I Dawn [name] am serving as a Education administrator for Dr. Hulan Saas.  This visit occurred during the SARS-CoV-2 public health emergency.  Safety protocols were in place, including screening questions prior to the visit, additional usage of staff PPE, and extensive cleaning of exam room while observing appropriate contact time as indicated for disinfecting solutions.   I'm seeing this patient by the request  of:  Plotnikov, Dawn Lacks, MD  CC: Back and neck pain follow-up  [id number]  [name] is a 49 y.o. female coming in with complaint of back and neck pain. OMT 11/21/2019. Patient states she is ok today. Feeling about the same. Not making much progress.  Patient has not been as active as she normally is at this time.  Patient describes the pain as a dull, throbbing aching pain.  Patient states that it is not as much as the radicular pain she was having previously.  Medications patient has been prescribed: Gabapentin Taking: Yes         Reviewed prior external information including notes and imaging from previsou exam, outside providers and external EMR if available.   As well as notes that were available from care everywhere and other healthcare systems.  Past medical history, social, surgical and family history all reviewed in electronic medical record.  No pertanent information unless stated regarding to the chief complaint.   Past Medical History:  Diagnosis Date  . Allergy   . Frequent headaches   . GERD (gastroesophageal reflux disease)   . H/O sinus surgery     Allergies  Allergen Reactions  . Amoxicillin      Review of Systems:  No headache, visual changes, nausea, vomiting, diarrhea, constipation, dizziness, abdominal pain, skin rash, fevers, chills, night sweats, weight loss, swollen lymph nodes, body aches, joint swelling, chest pain,  shortness of breath, mood changes. POSITIVE muscle aches  Objective  Blood pressure 100/60, pulse 71, height 5\' 5"  (1.651 m), weight 159 lb (72.1 kg), SpO2 98 %.   General: No apparent distress alert and oriented x3 mood and affect normal, dressed appropriately.  HEENT: Pupils equal, extraocular movements intact  Respiratory: Patient's speak in full sentences and does not appear short of breath  Cardiovascular: No lower extremity edema, non tender, no erythema  Gait normal with good balance and coordination.  MSK:  Non tender with full range of motion and good stability and symmetric strength and tone of shoulders, elbows, wrist, hip, knee and ankles bilaterally.  Back -neck exam does show some very mild loss of lordosis, negative straight leg test but tightness with Corky Sox.  Patient still has some mild pain with any extension greater than 5.  Neurovascularly intact distally.  Osteopathic findings   C7 flexed rotated and side bent left T3 extended rotated and side bent right inhaled rib T9 extended rotated and side bent left L2 flexed rotated and side bent right Sacrum right on right       Assessment and Plan:  Lumbar radiculopathy Chronic problem but more intermittent.  Mild exacerbation.  Still on the gabapentin which we could increase but will start Effexor at a very low dose of 37.5 to see if this will help with the daytime dosing.  I think there is a good chance it will make a difference.  Discussed continuing the home exercises and staying active.  Follow-up with me again 5 to 6 weeks.  Nonallopathic problems  Decision today to treat with OMT was based on Physical Exam  After verbal consent patient was treated with HVLA, ME, FPR techniques in cervical, rib, thoracic, lumbar, and sacral  areas  Patient tolerated the procedure well with improvement in symptoms  Patient given exercises, stretches and lifestyle modifications  See medications in patient instructions if  given  Patient will follow up in 4-8 weeks      The above documentation has been reviewed and is accurate and complete [name], DO       Note: This dictation was prepared with Dragon dictation along with smaller phrase technology. Any transcriptional errors that result from this process are unintentional.

## 2019-12-26 ENCOUNTER — Other Ambulatory Visit: Payer: Self-pay

## 2019-12-26 ENCOUNTER — Encounter: Payer: Self-pay | Admitting: Family Medicine

## 2019-12-26 ENCOUNTER — Ambulatory Visit: Payer: 59 | Admitting: Family Medicine

## 2019-12-26 VITALS — BP 100/60 | HR 71 | Ht 65.0 in | Wt 159.0 lb

## 2019-12-26 DIAGNOSIS — M999 Biomechanical lesion, unspecified: Secondary | ICD-10-CM | POA: Diagnosis not present

## 2019-12-26 DIAGNOSIS — M5416 Radiculopathy, lumbar region: Secondary | ICD-10-CM | POA: Diagnosis not present

## 2019-12-26 MED ORDER — VENLAFAXINE HCL ER 37.5 MG PO CP24: 37.5000 mg | ORAL_CAPSULE | Freq: Every day | ORAL | 0 refills | Status: DC

## 2019-12-26 NOTE — Assessment & Plan Note (Signed)
Chronic problem but more intermittent.  Mild exacerbation.  Still on the gabapentin which we could increase but will start Effexor at a very low dose of 37.5 to see if this will help with the daytime dosing.  I think there is a good chance it will make a difference.  Discussed continuing the home exercises and staying active.  Follow-up with me again 5 to 6 weeks.

## 2019-12-26 NOTE — Patient Instructions (Addendum)
Good to see you Effexor 37.5 Contine everything else  See me again in 5 weeks

## 2019-12-27 ENCOUNTER — Encounter: Payer: Self-pay | Admitting: Family Medicine

## 2019-12-27 MED ORDER — GABAPENTIN 100 MG PO CAPS: 200.0000 mg | ORAL_CAPSULE | Freq: Every day | ORAL | 1 refills | Status: DC

## 2020-01-17 ENCOUNTER — Other Ambulatory Visit: Payer: Self-pay | Admitting: Family Medicine

## 2020-01-19 NOTE — Telephone Encounter (Signed)
Refill done.  

## 2020-01-29 NOTE — Progress Notes (Signed)
[name] [address] [place] [address] Phone: [phone] Subjective:   I Dawn [name] am serving as a Neurosurgeon for Dr. Antoine Primas.  This visit occurred during the SARS-CoV-2 public health emergency.  Safety protocols were in place, including screening questions prior to the visit, additional usage of staff PPE, and extensive cleaning of exam room while observing appropriate contact time as indicated for disinfecting solutions.   I'm seeing this patient by the request  of:  Plotnikov, Georgina Quint, MD  CC: low back pain follow up   [id number]  Dawn [name] is a 49 y.o. female coming in with complaint of back and neck pain. OMT 12/26/2019. Patient states she is doing well. She traveled recently and hasn't been taking gabapentin because she left it in the hotel. Certain movements cause pain.  Patient states that the Effexor may have helped out a little bit but she does not know for sure.  Patient states that his mood has been tolerable.  Does not know if it has helped pain or not.  He does feel the gabapentin does help the pain  Medications patient has been prescribed: started gabapentin and EFFEXOR at the last exam   Taking: Yes         Reviewed prior external information including notes and imaging from previsou exam, outside providers and external EMR if available.   As well as notes that were available from care everywhere and other healthcare systems.  Past medical history, social, surgical and family history all reviewed in electronic medical record.  No pertanent information unless stated regarding to the chief complaint.   Past Medical History:  Diagnosis Date  . Allergy   . Frequent headaches   . GERD (gastroesophageal reflux disease)   . H/O sinus surgery     Allergies  Allergen Reactions  . Amoxicillin      Review of Systems:  No headache, visual changes, nausea, vomiting, diarrhea, constipation, dizziness,  abdominal pain, skin rash, fevers, chills, night sweats, weight loss, swollen lymph nodes, body aches, joint swelling, chest pain, shortness of breath, mood changes. POSITIVE muscle aches  Objective  Blood pressure 100/60, pulse 66, height 5\' 5"  (1.651 m), weight 151 lb (68.5 kg), SpO2 97 %.   General: No apparent distress alert and oriented x3 mood and affect normal, dressed appropriately.  HEENT: Pupils equal, extraocular movements intact  Respiratory: Patient's speak in full sentences and does not appear short of breath  Cardiovascular: No lower extremity edema, non tender, no erythema  Neuro: Cranial nerves II through XII are intact, neurovascularly intact in all extremities with 2+ DTRs and 2+ pulses.  Gait normal with good balance and coordination.  MSK:  Non tender with full range of motion and good stability and symmetric strength and tone of shoulders, elbows, wrist, hip, knee and ankles bilaterally.  Back -back exam has very mild loss of lordosis.  Negative straight leg test but still has tightness noted with certain movements.  Osteopathic findings  C5 flexed rotated and side bent left T3 extended rotated and side bent right inhaled rib T9 extended rotated and side bent left L2 flexed rotated and side bent right Sacrum right on right     Assessment and Plan:  Lumbar radiculopathy Patient is likely not having any true radicular symptoms at this time.  Discussed with patient about icing regimen and home exercises, patient is doing better with the Effexor on board.  Continue the gabapentin at a low dose.  Continue to be active.  Follow-up with me again 6 to 8 weeks   Nonallopathic problems  Decision today to treat with OMT was based on Physical Exam  After verbal consent patient was treated with HVLA, ME, FPR techniques in cervical, rib, thoracic, lumbar, and sacral  areas  Patient tolerated the procedure well with improvement in symptoms  Patient given exercises,  stretches and lifestyle modifications  See medications in patient instructions if given  Patient will follow up in 4-8 weeks      The above documentation has been reviewed and is accurate and complete [name], DO       Note: This dictation was prepared with Dragon dictation along with smaller phrase technology. Any transcriptional errors that result from this process are unintentional.

## 2020-01-30 ENCOUNTER — Ambulatory Visit: Payer: 59 | Admitting: Family Medicine

## 2020-01-30 ENCOUNTER — Other Ambulatory Visit: Payer: Self-pay

## 2020-01-30 ENCOUNTER — Encounter: Payer: Self-pay | Admitting: Family Medicine

## 2020-01-30 VITALS — BP 100/60 | HR 66 | Ht 65.0 in | Wt 151.0 lb

## 2020-01-30 DIAGNOSIS — M5416 Radiculopathy, lumbar region: Secondary | ICD-10-CM

## 2020-01-30 DIAGNOSIS — M999 Biomechanical lesion, unspecified: Secondary | ICD-10-CM | POA: Diagnosis not present

## 2020-01-30 MED ORDER — GABAPENTIN 100 MG PO CAPS: 200.0000 mg | ORAL_CAPSULE | Freq: Every day | ORAL | 3 refills | Status: DC

## 2020-01-30 NOTE — Patient Instructions (Addendum)
Good to see you Refilled gabapentin  Continue the effexor  Stay active So happy you are doing better  See me again in 6-8 weeks

## 2020-01-30 NOTE — Assessment & Plan Note (Signed)
Patient is likely not having any true radicular symptoms at this time.  Discussed with patient about icing regimen and home exercises, patient is doing better with the Effexor on board.  Continue the gabapentin at a low dose.  Continue to be active.  Follow-up with me again 6 to 8 weeks

## 2020-03-27 ENCOUNTER — Ambulatory Visit: Payer: 59 | Admitting: Family Medicine

## 2020-04-09 NOTE — Progress Notes (Signed)
[name] Phone: [phone] Subjective:   Dawn [name], am serving as a scribe for Dr. Hulan Saas. This visit occurred during the SARS-CoV-2 public health emergency.  Safety protocols were in place, including screening questions prior to the visit, additional usage of staff PPE, and extensive cleaning of exam room while observing appropriate contact time as indicated for disinfecting solutions.   I'm seeing this patient by the request  of:  Plotnikov, [name], MD  CC: Back and neck pain follow-up  [id number]  Dawn [name] is a 50 y.o. female coming in with complaint of back and neck pain. OMT 01/29/2021. Patient states that she has good and bad days. Using gabapentin most days. Also using Effexor.  Patient states has good days and bad days.  Patient states that overall nothing that really stops her from activity.  Sometimes when she does too much though does have to do more stretching.  If she does stretching regularly seems to do relatively well  Medications patient has been prescribed: Gabapentin, Effexor        Reviewed prior external information including notes and imaging from previsou exam, outside providers and external EMR if available.   As well as notes that were available from care everywhere and other healthcare systems.  Past medical history, social, surgical and family history all reviewed in electronic medical record.  [name] pertanent information unless stated regarding to the chief complaint.   Past Medical History:  Diagnosis Date  . Allergy   . Frequent headaches   . GERD (gastroesophageal reflux disease)   . H/O sinus surgery     Allergies  Allergen Reactions  . Amoxicillin      Review of Systems:  [name] headache, visual changes, nausea, vomiting, diarrhea, constipation, dizziness, abdominal pain, skin rash, fevers, chills, night sweats, weight loss, swollen lymph nodes,  body aches, joint swelling, chest pain, shortness of breath, mood changes. POSITIVE muscle aches  Objective  Blood pressure 104/82, pulse 61, height 5\' 5"  (1.651 m), weight 157 lb (71.2 kg), SpO2 98 %.   General: [name] apparent distress alert and oriented x3 mood and affect normal, dressed appropriately.  HEENT: Pupils equal, extraocular movements intact  Respiratory: Patient's speak in full sentences and does not appear short of breath  Cardiovascular: [name] lower extremity edema, non tender, [name] erythema  Gait normal with good balance and coordination.  MSK: Mild lateral tracking of the left knee noted with mild patellar grind test.  Otherwise fairly unremarkable Back -low back exam does have some mild loss of lordosis.  Patient does have tightness noted in the left sacroiliac joint which is different than usual.  Mild tightness of the FABER test on the left as well.  Negative straight leg test.  Mild pain with increased extension of the back  Osteopathic findings   C6 flexed rotated and side bent left T6 extended rotated and side bent right inhaled rib L2 flexed rotated and side bent right Sacrum left on left       Assessment and Plan:  Lumbar radiculopathy Patient has been on the Effexor now for greater than 5 months and doing relatively well.  We discussed the potential of going up with patient declined.  Patient is taking up to 4 pills of gabapentin at night to do it does help her when she needs it.  Patient does not take it regularly though.  Continues to be fairly active.  We discussed the potential  for injections in the back which patient also declined.  Continues to respond fairly well to osteopathic manipulation.  Follow-up with me again 6 weeks.  Bilateral patellofemoral syndrome Patient has had difficulty before.  Having some mild disc difficulty with the left knee.  Given refresher exercises by athletic trainer.  Follow-up again in 6 weeks worsening pain consider formal physical  therapy    Nonallopathic problems  Decision today to treat with OMT was based on Physical Exam  After verbal consent patient was treated with HVLA, ME, FPR techniques in cervical, rib, thoracic, lumbar, and sacral  areas  Patient tolerated the procedure well with improvement in symptoms  Patient given exercises, stretches and lifestyle modifications  See medications in patient instructions if given  Patient will follow up in 4-8 weeks      The above documentation has been reviewed and is accurate and complete [name], DO       Note: This dictation was prepared with Dragon dictation along with smaller phrase technology. Any transcriptional errors that result from this process are unintentional.

## 2020-04-10 ENCOUNTER — Other Ambulatory Visit: Payer: Self-pay

## 2020-04-10 ENCOUNTER — Encounter: Payer: Self-pay | Admitting: Family Medicine

## 2020-04-10 ENCOUNTER — Ambulatory Visit: Payer: 59 | Admitting: Family Medicine

## 2020-04-10 VITALS — BP 104/82 | HR 61 | Ht 65.0 in | Wt 157.0 lb

## 2020-04-10 DIAGNOSIS — M222X1 Patellofemoral disorders, right knee: Secondary | ICD-10-CM | POA: Diagnosis not present

## 2020-04-10 DIAGNOSIS — M222X2 Patellofemoral disorders, left knee: Secondary | ICD-10-CM | POA: Diagnosis not present

## 2020-04-10 DIAGNOSIS — M5416 Radiculopathy, lumbar region: Secondary | ICD-10-CM

## 2020-04-10 DIAGNOSIS — M999 Biomechanical lesion, unspecified: Secondary | ICD-10-CM

## 2020-04-10 NOTE — Assessment & Plan Note (Signed)
Patient has been on the Effexor now for greater than 5 months and doing relatively well.  We discussed the potential of going up with patient declined.  Patient is taking up to 4 pills of gabapentin at night to do it does help her when she needs it.  Patient does not take it regularly though.  Continues to be fairly active.  We discussed the potential for injections in the back which patient also declined.  Continues to respond fairly well to osteopathic manipulation.  Follow-up with me again 6 weeks.

## 2020-04-10 NOTE — Assessment & Plan Note (Signed)
Patient has had difficulty before.  Having some mild disc difficulty with the left knee.  Given refresher exercises by athletic trainer.  Follow-up again in 6 weeks worsening pain consider formal physical therapy

## 2020-04-10 NOTE — Patient Instructions (Signed)
Good to see you You are doing well Can increase effexor or injections in back  See me in 6-8 weeks

## 2020-05-28 NOTE — Progress Notes (Signed)
[place] Phone: [phone] Subjective:   [name], am serving as a scribe for Dr. Hulan Saas. This visit occurred during the SARS-CoV-2 public health emergency.  Safety protocols were in place, including screening questions prior to the visit, additional usage of staff PPE, and extensive cleaning of exam room while observing appropriate contact time as indicated for disinfecting solutions.   I'm seeing this patient by the request  of:  Plotnikov, [name], MD  CC: Back and neck pain follow-up  [id number]  Nicky Fiebelkorn is a 50 y.o. female coming in with complaint of back and neck pain. OMT 04/10/2020. Patient states that last weekend she was cleaning at home and developed lower back pain on L side. Has not had to use gabapentin and Effexor for past 2 weeks after she forgot medication when she was traveling. Denies any radiating symptoms and is able to continue to be active.   Medications patient has been prescribed: Effexor, Gabapentin Taking: No         Reviewed prior external information including notes and imaging from previsou exam, outside providers and external EMR if available.   As well as notes that were available from care everywhere and other healthcare systems.  Past medical history, social, surgical and family history all reviewed in electronic medical record.  No pertanent information unless stated regarding to the chief complaint.   Past Medical History:  Diagnosis Date  . Allergy   . Frequent headaches   . GERD (gastroesophageal reflux disease)   . H/O sinus surgery     Allergies  Allergen Reactions  . Amoxicillin      Review of Systems:  No headache, visual changes, nausea, vomiting, diarrhea, constipation, dizziness, abdominal pain, skin rash, fevers, chills, night sweats, weight loss, swollen lymph nodes, body aches, joint swelling, chest pain, shortness of breath,  mood changes. POSITIVE muscle aches but mild   Objective  Blood pressure 102/76, pulse 81, height 5\' 5"  (1.651 m), weight 152 lb (68.9 kg), SpO2 97 %.   General: No apparent distress alert and oriented x3 mood and affect normal, dressed appropriately.  HEENT: Pupils equal, extraocular movements intact  Respiratory: Patient's speak in full sentences and does not appear short of breath  Cardiovascular: No lower extremity edema, non tender, no erythema   Gait normal with good balance and coordination.  MSK:  Non tender with full range of motion and good stability and symmetric strength and tone of shoulders, elbows, wrist, hip, knee and ankles bilaterally.  Back -very mild tightness noted in the right parascapular region.  Patient does have very mild tightness in the thoracolumbar junction but otherwise fairly unremarkable.  With 5 out of 5 strength.  Osteopathic findings  C5 flexed rotated and side bent right T3 extended rotated and side bent right inhaled rib T6 extended rotated and side bent left L2 flexed rotated and side bent right Sacrum right on right       Assessment and Plan:  Low back pain Patient is doing so much better at this time.  Discussed icing regimen and home exercises, discussed continuing to stay active.  Patient is off all of her other medications at the moment and just has the Flexeril as needed.  Patient will follow up with me again in 8 to 12 weeks   Nonallopathic problems  Decision today to treat with OMT was based on Physical Exam  After verbal consent patient was treated with  HVLA, ME, FPR techniques in cervical, rib, thoracic, lumbar, and sacral  areas  Patient tolerated the procedure well with improvement in symptoms  Patient given exercises, stretches and lifestyle modifications  See medications in patient instructions if given  Patient will follow up in 8-12 weeks      The above documentation has been reviewed and is accurate and complete  [name], DO       Note: This dictation was prepared with Dragon dictation along with smaller phrase technology. Any transcriptional errors that result from this process are unintentional.

## 2020-05-29 ENCOUNTER — Other Ambulatory Visit: Payer: Self-pay

## 2020-05-29 ENCOUNTER — Ambulatory Visit: Payer: 59 | Admitting: Family Medicine

## 2020-05-29 ENCOUNTER — Encounter: Payer: Self-pay | Admitting: Family Medicine

## 2020-05-29 VITALS — BP 102/76 | HR 81 | Ht 65.0 in | Wt 152.0 lb

## 2020-05-29 DIAGNOSIS — M9901 Segmental and somatic dysfunction of cervical region: Secondary | ICD-10-CM | POA: Diagnosis not present

## 2020-05-29 DIAGNOSIS — M9902 Segmental and somatic dysfunction of thoracic region: Secondary | ICD-10-CM

## 2020-05-29 DIAGNOSIS — M9904 Segmental and somatic dysfunction of sacral region: Secondary | ICD-10-CM

## 2020-05-29 DIAGNOSIS — M545 Low back pain, unspecified: Secondary | ICD-10-CM

## 2020-05-29 DIAGNOSIS — M9908 Segmental and somatic dysfunction of rib cage: Secondary | ICD-10-CM

## 2020-05-29 DIAGNOSIS — G8929 Other chronic pain: Secondary | ICD-10-CM

## 2020-05-29 DIAGNOSIS — M9903 Segmental and somatic dysfunction of lumbar region: Secondary | ICD-10-CM | POA: Diagnosis not present

## 2020-05-29 NOTE — Patient Instructions (Signed)
Great to see you Congrats on tennis win Best you have been in a while See me in 2-3 months

## 2020-05-29 NOTE — Assessment & Plan Note (Signed)
Patient is doing so much better at this time.  Discussed icing regimen and home exercises, discussed continuing to stay active.  Patient is off all of her other medications at the moment and just has the Flexeril as needed.  Patient will follow up with me again in 8 to 12 weeks

## 2020-06-10 ENCOUNTER — Other Ambulatory Visit: Payer: Self-pay

## 2020-06-10 ENCOUNTER — Encounter: Payer: Self-pay | Admitting: Internal Medicine

## 2020-06-10 ENCOUNTER — Ambulatory Visit (INDEPENDENT_AMBULATORY_CARE_PROVIDER_SITE_OTHER): Payer: 59 | Admitting: Internal Medicine

## 2020-06-10 VITALS — BP 102/62 | HR 61 | Temp 98.3°F | Ht 65.0 in | Wt 157.8 lb

## 2020-06-10 DIAGNOSIS — Z0000 Encounter for general adult medical examination without abnormal findings: Secondary | ICD-10-CM

## 2020-06-10 DIAGNOSIS — M545 Low back pain, unspecified: Secondary | ICD-10-CM

## 2020-06-10 DIAGNOSIS — B9681 Helicobacter pylori [H. pylori] as the cause of diseases classified elsewhere: Secondary | ICD-10-CM

## 2020-06-10 DIAGNOSIS — E559 Vitamin D deficiency, unspecified: Secondary | ICD-10-CM

## 2020-06-10 DIAGNOSIS — E039 Hypothyroidism, unspecified: Secondary | ICD-10-CM

## 2020-06-10 DIAGNOSIS — Z23 Encounter for immunization: Secondary | ICD-10-CM

## 2020-06-10 LAB — LIPID PANEL
Cholesterol: 245 mg/dL — ABNORMAL HIGH (ref 0–200)
HDL: 76.7 mg/dL (ref >39.00)
LDL Cholesterol: 148 mg/dL — ABNORMAL HIGH (ref 0–99)
NonHDL: 167.98
Total CHOL/HDL Ratio: 3
Triglycerides: 102 mg/dL (ref 0.0–149.0)
VLDL: 20.4 mg/dL (ref 0.0–40.0)

## 2020-06-10 LAB — CBC WITH DIFFERENTIAL/PLATELET
Basophils Absolute: 0.1 10*3/uL (ref 0.0–0.1)
Basophils Relative: 1.4 % (ref 0.0–3.0)
Eosinophils Absolute: 0.2 10*3/uL (ref 0.0–0.7)
Eosinophils Relative: 3.2 % (ref 0.0–5.0)
HCT: 43.3 % (ref 36.0–46.0)
Hemoglobin: 14.4 g/dL (ref 12.0–15.0)
Lymphocytes Relative: 42.4 % (ref 12.0–46.0)
Lymphs Abs: 2.2 10*3/uL (ref 0.7–4.0)
MCHC: 33.2 g/dL (ref 30.0–36.0)
MCV: 90.3 fl (ref 78.0–100.0)
Monocytes Absolute: 0.4 10*3/uL (ref 0.1–1.0)
Monocytes Relative: 7.6 % (ref 3.0–12.0)
Neutro Abs: 2.4 10*3/uL (ref 1.4–7.7)
Neutrophils Relative %: 45.4 % (ref 43.0–77.0)
Platelets: 271 10*3/uL (ref 150.0–400.0)
RBC: 4.8 Mil/uL (ref 3.87–5.11)
RDW: 14.3 % (ref 11.5–15.5)
WBC: 5.2 10*3/uL (ref 4.0–10.5)

## 2020-06-10 LAB — URINALYSIS
Bilirubin Urine: NEGATIVE
Ketones, ur: NEGATIVE
Leukocytes,Ua: NEGATIVE
Nitrite: NEGATIVE
Specific Gravity, Urine: 1.01 (ref 1.000–1.030)
Total Protein, Urine: NEGATIVE
Urine Glucose: NEGATIVE
Urobilinogen, UA: 0.2 (ref 0.0–1.0)
pH: 6.5 (ref 5.0–8.0)

## 2020-06-10 LAB — COMPREHENSIVE METABOLIC PANEL
ALT: 26 U/L (ref 0–35)
AST: 26 U/L (ref 0–37)
Albumin: 4.7 g/dL (ref 3.5–5.2)
Alkaline Phosphatase: 62 U/L (ref 39–117)
BUN: 10 mg/dL (ref 6–23)
CO2: 29 mEq/L (ref 19–32)
Calcium: 9.5 mg/dL (ref 8.4–10.5)
Chloride: 103 mEq/L (ref 96–112)
Creatinine, Ser: 0.74 mg/dL (ref 0.40–1.20)
GFR: 95.6 mL/min (ref >60.00)
Glucose, Bld: 99 mg/dL (ref 70–99)
Potassium: 4.3 mEq/L (ref 3.5–5.1)
Sodium: 140 mEq/L (ref 135–145)
Total Bilirubin: 0.4 mg/dL (ref 0.2–1.2)
Total Protein: 7.7 g/dL (ref 6.0–8.3)

## 2020-06-10 LAB — T4, FREE: Free T4: 0.86 ng/dL (ref 0.60–1.60)

## 2020-06-10 LAB — TSH: TSH: 3.75 u[IU]/mL (ref 0.35–4.50)

## 2020-06-10 NOTE — Assessment & Plan Note (Signed)
Much better 

## 2020-06-10 NOTE — Addendum Note (Signed)
Addended by: Boris Lown B on: 06/10/2020 09:01 AM   Modules accepted: Orders

## 2020-06-10 NOTE — Assessment & Plan Note (Addendum)
On Vit D 

## 2020-06-10 NOTE — Progress Notes (Signed)
Subjective:  Patient ID: Dawn [name], female    DOB: 07-23-1970  Age: 50 y.o. MRN: [id number]  CC: Annual Exam   HPI Dawn [name] presents for a well exam  Outpatient Medications Prior to Visit  Medication Sig Dispense Refill  . Cholecalciferol (VITAMIN D3) 2000 units capsule Take 1 capsule (2,000 Units total) by mouth daily. 100 capsule 3  . furosemide (LASIX) 20 MG tablet Take 1 tablet (20 mg total) by mouth daily as needed. (Patient taking differently: Take 20 mg by mouth daily as needed for edema.) 90 tablet 0  . loratadine (CLARITIN) 10 MG tablet Take 1 tablet (10 mg total) by mouth daily. (Patient taking differently: Take 10 mg by mouth daily as needed.) 90 tablet 3  . cyclobenzaprine (FLEXERIL) 10 MG tablet Take 10 mg by mouth 3 (three) times daily as needed for muscle spasms. (Patient not taking: No sig reported)     No facility-administered medications prior to visit.    ROS: Review of Systems  Constitutional: Negative for activity change, appetite change, chills, fatigue and unexpected weight change.  HENT: Negative for congestion, mouth sores and sinus pressure.   Eyes: Negative for visual disturbance.  Respiratory: Negative for cough and chest tightness.   Gastrointestinal: Negative for abdominal pain and nausea.  Genitourinary: Negative for difficulty urinating, frequency and vaginal pain.  Musculoskeletal: Positive for back pain. Negative for gait problem.  Skin: Negative for pallor and rash.  Neurological: Negative for dizziness, tremors, weakness, numbness and headaches.  Psychiatric/Behavioral: Negative for confusion and sleep disturbance.    Objective:  BP 102/62 (BP Location: Left Arm)   Pulse 61   Temp 98.3 F (36.8 C) (Oral)   Ht 5\' 5"  (1.651 m)   Wt 157 lb 12.8 oz (71.6 kg)   SpO2 97%   BMI 26.26 kg/m   BP Readings from Last 3 Encounters:  06/10/20 102/62  05/29/20 102/76  04/10/20 104/82    Wt Readings from Last 3 Encounters:   06/10/20 157 lb 12.8 oz (71.6 kg)  05/29/20 152 lb (68.9 kg)  04/10/20 157 lb (71.2 kg)    Physical Exam Constitutional:      General: She is not in acute distress.    Appearance: She is well-developed.  HENT:     Head: Normocephalic.     Right Ear: External ear normal.     Left Ear: External ear normal.     Nose: Nose normal.  Eyes:     General:        Right eye: No discharge.        Left eye: No discharge.     Conjunctiva/sclera: Conjunctivae normal.     Pupils: Pupils are equal, round, and reactive to light.  Neck:     Thyroid: No thyromegaly.     Vascular: No JVD.     Trachea: No tracheal deviation.  Cardiovascular:     Rate and Rhythm: Normal rate and regular rhythm.     Heart sounds: Normal heart sounds.  Pulmonary:     Effort: No respiratory distress.     Breath sounds: No stridor. No wheezing.  Abdominal:     General: Bowel sounds are normal. There is no distension.     Palpations: Abdomen is soft. There is no mass.     Tenderness: There is no abdominal tenderness. There is no guarding or rebound.  Musculoskeletal:        General: No tenderness.     Cervical back: Normal range of motion and  neck supple.  Lymphadenopathy:     Cervical: No cervical adenopathy.  Skin:    Findings: No erythema or rash.  Neurological:     Cranial Nerves: No cranial nerve deficit.     Motor: No abnormal muscle tone.     Coordination: Coordination normal.     Deep Tendon Reflexes: Reflexes normal.  Psychiatric:        Behavior: Behavior normal.        Thought Content: Thought content normal.        Judgment: Judgment normal.     Lab Results  Component Value Date   WBC 4.9 05/24/2019   HGB 13.9 05/24/2019   HCT 41.4 05/24/2019   PLT 258.0 05/24/2019   GLUCOSE 94 05/24/2019   CHOL 256 (H) 05/24/2019   TRIG 99.0 05/24/2019   HDL 75.40 05/24/2019   LDLCALC 161 (H) 05/24/2019   ALT 21 05/24/2019   AST 23 05/24/2019   NA 138 05/24/2019   K 3.8 05/24/2019   CL 102  05/24/2019   CREATININE 0.71 05/24/2019   BUN 9 05/24/2019   CO2 28 05/24/2019   TSH 2.74 05/24/2019    MR Lumbar Spine Wo Contrast  Result Date: 10/27/2019 CLINICAL DATA:  Lumbar radiculopathy. EXAM: MRI LUMBAR SPINE WITHOUT CONTRAST TECHNIQUE: Multiplanar, multisequence MR imaging of the lumbar spine was performed. No intravenous contrast was administered. COMPARISON:  Plain films October 03, 2019 FINDINGS: Segmentation:  Standard. Alignment:  Physiologic. Vertebrae: No fracture, evidence of discitis, or bone lesion. Endplate degenerative changes at L4-5 and L5-S1. Conus medullaris and cauda equina: Conus extends to the T12-L1 level. Conus and cauda equina appear normal. Paraspinal and other soft tissues: Negative. Disc levels: T12-L1: No spinal canal or neural foraminal stenosis. L1-2: Shallow disc bulge without spinal canal or neural foraminal stenosis. L2-3: Small central disc protrusion causing small indentation of the thecal sac without significant spinal canal or neural foraminal stenosis. L3-4: No spinal canal or neural foraminal stenosis. L4-5: Mild loss of disc high, left asymmetric disc bulge causing mild narrowing of the left subarticular zone without significant spinal or neural foraminal stenosis. L5-S1: Mild loss of disc high, small superiorly migrating central disc extrusion and mild facet degenerative changes without significant spinal canal or neural foraminal stenosis. IMPRESSION: 1. Mild multilevel degenerative changes of the lumbar spine without high-grade spinal canal or neural foraminal stenosis at any level. 2. Mild narrowing of the left subarticular zone at L4-L5. 3. Small superiorly migrating central disc extrusion at L5-S1 without neural impingement. Electronically Signed   By: Pedro Earls M.D.   On: 10/27/2019 09:15    Assessment & Plan:    Walker Kehr, MD

## 2020-06-10 NOTE — Assessment & Plan Note (Signed)
TSH, FT4 

## 2020-06-10 NOTE — Addendum Note (Signed)
Addended by: Earnstine Regal on: 06/10/2020 09:09 AM   Modules accepted: Orders

## 2020-06-10 NOTE — Assessment & Plan Note (Signed)
Doing well on gluten free diet 

## 2020-06-10 NOTE — Assessment & Plan Note (Signed)

## 2020-07-30 NOTE — Progress Notes (Signed)
[facility] [place] Phone: [phone] Subjective:   Dawn Dawn [name], am serving as a scribe for Dr. Hulan Saas.  This visit occurred during the SARS-CoV-2 public health emergency.  Safety protocols were in place, including screening questions prior to the visit, additional usage of staff PPE, and extensive cleaning of exam room while observing appropriate contact time as indicated for disinfecting solutions.    I'm seeing this patient by the request  of:  Plotnikov, [name], MD  CC: Low back pain follow-up, neck pain follow-up  [id number]  Dawn Dawn [name] is a 50 y.o. female coming in with complaint of back and neck pain. OMT 05/29/2020. Patient states that she has been able to play tennis and Dawn [name] pain.  Patient states that usually afterwards patient does have a feeling of tightness.  Nothing severe that stops her from activity at this time.  Sometimes can be uncomfortable at night but nothing that keeps her up.  Medications patient has been prescribed: None         Reviewed prior external information including notes and imaging from previsou exam, outside providers and external EMR if available.   As well as notes that were available from care everywhere and other healthcare systems.  Past medical history, social, surgical and family history all reviewed in electronic medical record.  Dawn [name] pertanent information unless stated regarding to the chief complaint.   Past Medical History:  Diagnosis Date   Allergy    Frequent headaches    GERD (gastroesophageal reflux disease)    H/O sinus surgery     Allergies  Allergen Reactions   Amoxicillin      Review of Systems:  Dawn [name] headache, visual changes, nausea, vomiting, diarrhea, constipation, dizziness, abdominal pain, skin rash, fevers, chills, night sweats, weight loss, swollen lymph nodes, body aches, joint swelling, chest pain, shortness of breath, mood  changes. POSITIVE muscle aches  Objective  Blood pressure 102/76, pulse 71, height 5\' 5"  (1.651 m), weight 159 lb (72.1 kg), SpO2 98 %.   General: Dawn [name] apparent distress alert and oriented x3 mood and affect normal, dressed appropriately.  HEENT: Pupils equal, extraocular movements intact  Respiratory: Patient's speak in full sentences and does not appear short of breath  Cardiovascular: Dawn [name] lower extremity edema, non tender, Dawn [name] erythema  Gait normal with good balance and coordination.  MSK:  Non tender with full range of motion and good stability and symmetric strength and tone of shoulders, elbows, wrist, hip, knee and ankles bilaterally.  Back -low back exam does have some mild loss of lordosis.  Some mild tenderness to palpation in the paraspinal musculature of the right sacroiliac joint.  Patient does have tightness in the right parascapular region as well.  Osteopathic findings   C7 flexed rotated and side bent right  T3 extended rotated and side bent right inhaled rib T7 extended rotated and side bent left L2 flexed rotated and side bent right Sacrum right on right       Assessment and Plan:  Lumbar radiculopathy Patient has been doing remarkably well overall though.  Discussed icing regimen and home exercises, consider to avoid.  Patient will follow up with me again in 2 months   Nonallopathic problems  Decision today to treat with OMT was based on Physical Exam  After verbal consent patient was treated with HVLA, ME, FPR techniques in cervical, rib, thoracic, lumbar, and sacral  areas  Patient tolerated the procedure well with improvement  in symptoms  Patient given exercises, stretches and lifestyle modifications  See medications in patient instructions if given  Patient will follow up in 8 weeks      The above documentation has been reviewed and is accurate and complete [name], DO        Note: This dictation was prepared with Dragon dictation  along with smaller phrase technology. Any transcriptional errors that result from this process are unintentional.

## 2020-07-31 ENCOUNTER — Ambulatory Visit: Payer: 59 | Admitting: Family Medicine

## 2020-07-31 ENCOUNTER — Encounter: Payer: Self-pay | Admitting: Family Medicine

## 2020-07-31 ENCOUNTER — Other Ambulatory Visit: Payer: Self-pay

## 2020-07-31 VITALS — BP 102/76 | HR 71 | Ht 65.0 in | Wt 159.0 lb

## 2020-07-31 DIAGNOSIS — M9904 Segmental and somatic dysfunction of sacral region: Secondary | ICD-10-CM

## 2020-07-31 DIAGNOSIS — M9902 Segmental and somatic dysfunction of thoracic region: Secondary | ICD-10-CM

## 2020-07-31 DIAGNOSIS — M9903 Segmental and somatic dysfunction of lumbar region: Secondary | ICD-10-CM | POA: Diagnosis not present

## 2020-07-31 DIAGNOSIS — M9901 Segmental and somatic dysfunction of cervical region: Secondary | ICD-10-CM | POA: Diagnosis not present

## 2020-07-31 DIAGNOSIS — M9908 Segmental and somatic dysfunction of rib cage: Secondary | ICD-10-CM | POA: Diagnosis not present

## 2020-07-31 DIAGNOSIS — M5416 Radiculopathy, lumbar region: Secondary | ICD-10-CM

## 2020-07-31 NOTE — Patient Instructions (Signed)
Great to see you as always Travel safe  Keep kicking butt on the tennis court  See me again in 2 months

## 2020-07-31 NOTE — Assessment & Plan Note (Signed)
Patient has been doing remarkably well overall though.  Discussed icing regimen and home exercises, consider to avoid.  Patient will follow up with me again in 2 months

## 2020-10-02 ENCOUNTER — Encounter: Payer: Self-pay | Admitting: Family Medicine

## 2020-10-02 ENCOUNTER — Other Ambulatory Visit: Payer: Self-pay

## 2020-10-02 ENCOUNTER — Ambulatory Visit: Payer: 59 | Admitting: Family Medicine

## 2020-10-02 ENCOUNTER — Ambulatory Visit: Payer: Self-pay

## 2020-10-02 VITALS — BP 120/70 | HR 82 | Ht 65.0 in | Wt 156.0 lb

## 2020-10-02 DIAGNOSIS — M79661 Pain in right lower leg: Secondary | ICD-10-CM | POA: Diagnosis not present

## 2020-10-02 DIAGNOSIS — S86811A Strain of other muscle(s) and tendon(s) at lower leg level, right leg, initial encounter: Secondary | ICD-10-CM | POA: Diagnosis not present

## 2020-10-02 NOTE — Assessment & Plan Note (Signed)
Patient does have what appears to be a right calf strain.  Pretty significant.  No significant no true tearing noted.  Discussed with patient about compression sleeve, icing regimen, heel lifts.  Patient will avoid high impact or deceleration exercises for short time.  Patient will increase activity as tolerated.  Follow-up with me again in 4 weeks

## 2020-10-02 NOTE — Progress Notes (Signed)
  Dawn [name] [address] [place] Phone: [phone] Subjective:   IVilma Meckel, am serving as a scribe for Dr. Hulan Saas. This visit occurred during the SARS-CoV-2 public health emergency.  Safety protocols were in place, including screening questions prior to the visit, additional usage of staff PPE, and extensive cleaning of exam room while observing appropriate contact time as indicated for disinfecting solutions.   I'm seeing this patient by the request  of:  Plotnikov, Dawn Lacks, MD  CC: Right leg pain  [id number]  Dawn [name] is a 50 y.o. female coming in with complaint of back and neck pain. OMT 07/31/2020. Patient states lower back is feeling good. She injured her right calf earlier this week and is having pain during knee extension.  Patient denies any swelling, and bruising that occurred.       \   Past Medical History:  Diagnosis Date   Allergy    Frequent headaches    GERD (gastroesophageal reflux disease)    H/O sinus surgery     Allergies  Allergen Reactions   Amoxicillin      Review of Systems:  No headache, visual changes, nausea, vomiting, diarrhea, constipation, dizziness, abdominal pain, skin rash, fevers, chills, night sweats, weight loss, swollen lymph nodes, body aches, joint swelling, chest pain, shortness of breath, mood changes.   Objective  There were no vitals taken for this visit.   General: No apparent distress alert and oriented x3 mood and affect normal, dressed appropriately.  HEENT: Pupils equal, extraocular movements intact  Respiratory: Patient's speak in full sentences and does not appear short of breath  Cardiovascular: No lower extremity edema, non tender, no erythema  Mild antalgic gait noted. Right calf does have some tenderness in the medial gastroc head.  No pain at the Achilles.  Full range of motion of the ankle.  Full range of motion of the knee as well.  No  significant bruising or swelling noted.  No pain in the popliteal area.  Limited muscular skeletal ultrasound was performed and interpreted by Hulan Saas, M  Limited ultrasound shows the patient does have mild hypoechoic changes noted of the fascia and some of the musculature.  No true acute tear or retraction noted. Impression: Likely medial gastroc strain       Assessment and Plan:        The above documentation has been reviewed and is accurate and complete [name], DO       Note: This dictation was prepared with Dragon dictation along with smaller phrase technology. Any transcriptional errors that result from this process are unintentional.

## 2020-10-02 NOTE — Patient Instructions (Signed)
Exercises at least 3x a week Wedge shoes or shoes with heel Wear calf compression sleeve or socks with activity No tennis for a week then hitting drillings after that [place] to bike and to do upper body lifting   See you again in 4 weeks

## 2020-10-29 NOTE — Progress Notes (Signed)
[facility] Phone: [phone] Subjective:   Dawn [name], am serving as a scribe for Dr. Hulan Saas.  This visit occurred during the SARS-CoV-2 public health emergency.  Safety protocols were in place, including screening questions prior to the visit, additional usage of staff PPE, and extensive cleaning of exam room while observing appropriate contact time as indicated for disinfecting solutions.   I'm seeing this patient by the request  of:  Plotnikov, [name], MD  CC: right hip pain   [id number]  10/02/2020 Patient does have what appears to be a right calf strain.  Pretty significant.  [name] significant [name] true tearing noted.  Discussed with patient about compression sleeve, icing regimen, heel lifts.  Patient will avoid high impact or deceleration exercises for short time.  Patient will increase activity as tolerated.  Follow-up with me again in 4 weeks  Updated 10/30/2020 Dawn [name] is a 50 y.o. female coming in with complaint of right calf pain and back pain. Pain in calf is has subsided. Is wearing in compression socks with playing tennis.  Patient states that not having any significant discomfort at this time.  Back pain is doing much better. Is back in the gym for strength training.       Past Medical History:  Diagnosis Date   Allergy    Frequent headaches    GERD (gastroesophageal reflux disease)    H/O sinus surgery    [name] past surgical history on file. Social History   Socioeconomic History   Marital status: Married    Spouse name: Not on file   Number of children: 3   Years of education: Not on file   Highest education level: Master's degree (e.g., MA, MS, MEng, MEd, MSW, MBA)  Occupational History   Not on file  Tobacco Use   Smoking status: Never   Smokeless tobacco: Never  Substance and Sexual Activity   Alcohol use: Yes   Drug use: [name]   Sexual activity: Yes    Birth  control/protection: I.U.D.  Other Topics Concern   Not on file  Social History Narrative   Not on file   Social Determinants of Health   Financial Resource Strain: Not on file  Food Insecurity: Not on file  Transportation Needs: Not on file  Physical Activity: Not on file  Stress: Not on file  Social Connections: Not on file   Allergies  Allergen Reactions   Amoxicillin    Family History  Problem Relation Age of Onset   Asthma Mother    Hyperlipidemia Mother    Cancer Maternal Grandmother    Hypertension Maternal Grandmother    Kidney disease Maternal Grandmother    Cancer Maternal Grandfather    Early death Paternal Grandmother    Diabetes Paternal Grandfather    Arthritis Paternal Grandfather    Hypertension Paternal Grandfather      Current Outpatient Medications (Cardiovascular):    furosemide (LASIX) 20 MG tablet, Take 1 tablet (20 mg total) by mouth daily as needed. (Patient taking differently: Take 20 mg by mouth daily as needed for edema.)  Current Outpatient Medications (Respiratory):    loratadine (CLARITIN) 10 MG tablet, Take 1 tablet (10 mg total) by mouth daily. (Patient taking differently: Take 10 mg by mouth daily as needed.)    Current Outpatient Medications (Other):    Cholecalciferol (VITAMIN D3) 2000 units capsule, Take 1 capsule (2,000 Units total) by mouth daily.   Reviewed  prior external information including notes and imaging from  primary care provider As well as notes that were available from care everywhere and other healthcare systems.  Past medical history, social, surgical and family history all reviewed in electronic medical record.  [name] pertanent information unless stated regarding to the chief complaint.   Review of Systems:  [name] headache, visual changes, nausea, vomiting, diarrhea, constipation, dizziness, abdominal pain, skin rash, fevers, chills, night sweats, weight loss, swollen lymph nodes, body aches, joint swelling, chest  pain, shortness of breath, mood changes. POSITIVE muscle aches  Objective  Blood pressure 100/64, pulse 82, height 5\' 5"  (1.651 m), weight 149 lb (67.6 kg), SpO2 97 %.   General: [name] apparent distress alert and oriented x3 mood and affect normal, dressed appropriately.  HEENT: Pupils equal, extraocular movements intact  Respiratory: Patient's speak in full sentences and does not appear short of breath  Cardiovascular: [name] lower extremity edema, non tender, [name] erythema  Gait normal with good balance and coordination.  MSK: Patient does not have any significant difficulties at the moment in the calf itself.  Patient is nontender.  Full strength of the ankle. Patient's low back does still have some tightness noted more in the thoracolumbar junction.  Osteopathic findings C3 flexed rotated and side bent right T3 extended rotated and side bent right inhaled third rib T9 extended rotated and side bent left L2 flexed rotated and side bent right Sacrum right on right    Impression and Recommendations:     The above documentation has been reviewed and is accurate and complete [name], DO

## 2020-10-30 ENCOUNTER — Encounter: Payer: Self-pay | Admitting: Family Medicine

## 2020-10-30 ENCOUNTER — Other Ambulatory Visit: Payer: Self-pay

## 2020-10-30 ENCOUNTER — Ambulatory Visit: Payer: Self-pay

## 2020-10-30 ENCOUNTER — Ambulatory Visit: Payer: 59 | Admitting: Family Medicine

## 2020-10-30 VITALS — BP 100/64 | HR 82 | Ht 65.0 in | Wt 149.0 lb

## 2020-10-30 DIAGNOSIS — M9908 Segmental and somatic dysfunction of rib cage: Secondary | ICD-10-CM | POA: Diagnosis not present

## 2020-10-30 DIAGNOSIS — M9904 Segmental and somatic dysfunction of sacral region: Secondary | ICD-10-CM | POA: Diagnosis not present

## 2020-10-30 DIAGNOSIS — M9902 Segmental and somatic dysfunction of thoracic region: Secondary | ICD-10-CM | POA: Diagnosis not present

## 2020-10-30 DIAGNOSIS — M9903 Segmental and somatic dysfunction of lumbar region: Secondary | ICD-10-CM

## 2020-10-30 DIAGNOSIS — M9901 Segmental and somatic dysfunction of cervical region: Secondary | ICD-10-CM | POA: Diagnosis not present

## 2020-10-30 DIAGNOSIS — M5416 Radiculopathy, lumbar region: Secondary | ICD-10-CM | POA: Diagnosis not present

## 2020-10-30 DIAGNOSIS — S86811A Strain of other muscle(s) and tendon(s) at lower leg level, right leg, initial encounter: Secondary | ICD-10-CM | POA: Diagnosis not present

## 2020-10-30 NOTE — Assessment & Plan Note (Signed)
Significant improvement at this time.  No significant difficulty.  Follow-up with me again 6 to 8 weeks

## 2020-10-30 NOTE — Patient Instructions (Signed)
See me again in 7-8 weeks 

## 2020-10-30 NOTE — Assessment & Plan Note (Signed)
Patient is doing relatively well at the moment.  Discussed icing regimen and home exercises.  Still responding well to osteopathic manipulation.  Patient will be increasing activity.  Follow-up with me again in 7 to 8 weeks

## 2020-12-23 ENCOUNTER — Encounter: Payer: Self-pay | Admitting: Family Medicine

## 2020-12-23 ENCOUNTER — Other Ambulatory Visit: Payer: Self-pay

## 2020-12-23 ENCOUNTER — Ambulatory Visit (INDEPENDENT_AMBULATORY_CARE_PROVIDER_SITE_OTHER): Payer: 59

## 2020-12-23 ENCOUNTER — Ambulatory Visit: Payer: Self-pay

## 2020-12-23 ENCOUNTER — Ambulatory Visit: Payer: 59 | Admitting: Family Medicine

## 2020-12-23 VITALS — BP 108/62 | HR 65 | Wt 140.0 lb

## 2020-12-23 DIAGNOSIS — M25511 Pain in right shoulder: Secondary | ICD-10-CM | POA: Diagnosis not present

## 2020-12-23 NOTE — Assessment & Plan Note (Signed)
Seems to be acute on chronic.  Does not seem to be as much of the acromioclavicular pain that she has had previously.  Discussed icing regimen and home exercises.  Discussed decreasing certain range of motion and keeping hands within peripheral vision.  We will get x-rays to further evaluate.  Discussed icing regimen.  Follow-up with me again in 6 to 8 weeks.  Continue to have pain consider injection as well as formal physical therapy

## 2020-12-23 NOTE — Patient Instructions (Addendum)
Do prescribed exercises at least 3x a week Xray today See you again in 4-6 weeks

## 2020-12-23 NOTE — Progress Notes (Signed)
[place] Phone: [phone] Subjective:    I'm seeing this patient by the request  of:  Plotnikov, [name], MD  CC: Right shoulder pain  [id number]  10/30/2020 Patient is doing relatively well at the moment.  Discussed icing regimen and home exercises.  Still responding well to osteopathic manipulation.  Patient will be increasing activity.  Follow-up with me again in 7 to 8 weeks Significant improvement at this time.  No significant difficulty.  Follow-up with me again 6 to 8 weeks  Updated 12/23/2020 Rhiannan Elsen is a 50 y.o. female coming in with complaint of right shoulder pain that started 4-6 weeks ago. Can produce pain when shoulder is at 90 degrees and she tries to extend her elbow or ER. No clicking or popping. As long as she doesn't move her shoulder in that way it doesn't hurt. Achy pain that runs from shoulder to mid arm on the posterior side.       Past Medical History:  Diagnosis Date   Allergy    Frequent headaches    GERD (gastroesophageal reflux disease)    H/O sinus surgery    No past surgical history on file. Social History   Socioeconomic History   Marital status: Married    Spouse name: Not on file   Number of children: 3   Years of education: Not on file   Highest education level: Master's degree (e.g., MA, MS, MEng, MEd, MSW, MBA)  Occupational History   Not on file  Tobacco Use   Smoking status: Never   Smokeless tobacco: Never  Substance and Sexual Activity   Alcohol use: Yes   Drug use: No   Sexual activity: Yes    Birth control/protection: I.U.D.  Other Topics Concern   Not on file  Social History Narrative   Not on file   Social Determinants of Health   Financial Resource Strain: Not on file  Food Insecurity: Not on file  Transportation Needs: Not on file  Physical Activity: Not on file  Stress: Not on file  Social Connections: Not on file    Allergies  Allergen Reactions   Amoxicillin    Family History  Problem Relation Age of Onset   Asthma Mother    Hyperlipidemia Mother    Cancer Maternal Grandmother    Hypertension Maternal Grandmother    Kidney disease Maternal Grandmother    Cancer Maternal Grandfather    Early death Paternal Grandmother    Diabetes Paternal Grandfather    Arthritis Paternal Grandfather    Hypertension Paternal Grandfather      Current Outpatient Medications (Cardiovascular):    furosemide (LASIX) 20 MG tablet, Take 1 tablet (20 mg total) by mouth daily as needed. (Patient taking differently: Take 20 mg by mouth daily as needed for edema.)  Current Outpatient Medications (Respiratory):    loratadine (CLARITIN) 10 MG tablet, Take 1 tablet (10 mg total) by mouth daily. (Patient taking differently: Take 10 mg by mouth daily as needed.)    Current Outpatient Medications (Other):    Cholecalciferol (VITAMIN D3) 2000 units capsule, Take 1 capsule (2,000 Units total) by mouth daily.   Reviewed prior external information including notes and imaging from  primary care provider As well as notes that were available from care everywhere and other healthcare systems.  Past medical history, social, surgical and family history all reviewed in electronic medical record.  No pertanent information unless stated regarding to the chief  complaint.   Review of Systems:  No headache, visual changes, nausea, vomiting, diarrhea, constipation, dizziness, abdominal pain, skin rash, fevers, chills, night sweats, weight loss, swollen lymph nodes, body aches, joint swelling, chest pain, shortness of breath, mood changes. POSITIVE muscle aches  Objective  Blood pressure 108/62, pulse 65, weight 140 lb (63.5 kg), SpO2 98 %.   General: No apparent distress alert and oriented x3 mood and affect normal, dressed appropriately.  HEENT: Pupils equal, extraocular movements intact  Respiratory: Patient's speak in full  sentences and does not appear short of breath  Cardiovascular: No lower extremity edema, non tender, no erythema  Gait normal with good balance and coordination.  MSK:   Shoulder: Right Inspection reveals no abnormalities, atrophy or asymmetry. Palpation is normal with no tenderness over AC joint or bicipital groove. ROM is full in all planes passively. Rotator cuff strength normal throughout. signs of impingement with positive Neer and Hawkin's tests, but negative empty can sign. Speeds and Yergason's tests normal. No labral pathology noted with negative Obrien's, negative clunk and good stability. Normal scapular function observed. No painful arc and no drop arm sign. No apprehension sign  MSK US performed of: Right This study was ordered, performed, and interpreted by Charlann Boxer D.O.  Shoulder:   Supraspinatus:  Appears normal on long and transverse views, Bursal bulge seen with shoulder abduction on impingement view. Subscapularis:  Appears normal on long and transverse views. Positive bursa AC joint: Mild arthritic changes noted Glenohumeral Joint:  Appears normal without effusion. Glenoid Labrum:  Intact without visualized tears. Biceps Tendon:  Appears normal on long and transverse views, no fraying of tendon, tendon located in intertubercular groove, no subluxation with shoulder internal or external rotation.  Impression: Subacromial bursitis      Impression and Recommendations:     The above documentation has been reviewed and is accurate and complete [name], DO

## 2021-02-04 NOTE — Progress Notes (Signed)
Dawn [name]: [phone] Subjective:   Dawn [name], am serving as a scribe for Dr. Hulan Saas.This visit occurred during the SARS-CoV-2 public health emergency.  Safety protocols were in place, including screening questions prior to the visit, additional usage of staff PPE, and extensive cleaning of exam room while observing appropriate contact time as indicated for disinfecting solutions.  I'm seeing this patient by the request  of:  Plotnikov, [name], MD  CC: Back and neck pain follow-up  [id number]  12/23/2020 Seems to be acute on chronic.  Does not seem to be as much of the acromioclavicular pain that she has had previously.  Discussed icing regimen and home exercises.  Discussed decreasing certain range of motion and keeping hands within peripheral vision.  We will get x-rays to further evaluate.  Discussed icing regimen.  Follow-up with me again in 6 to 8 weeks.  Continue to have pain consider injection as well as formal physical therapy  Update 02/05/2021 Dawn [name] is a 51 y.o. female coming in with complaint of R shoulder pain. Patient states that she has sharp pain with flexion and ER that will shoot down the tricep. Most of the time her arm will not hurt though.   Lumbar spine pain increased yesterday when she lunged forward and reached out for something. Denies any radiating symptoms.     Xray R shoulder 12/23/2020 FINDINGS: [name] fracture or dislocation. Glenohumeral and acromioclavicular joint spaces are preserved. [name] evidence of calcific tendinitis. Limited visualization of the adjacent thorax is normal. Regional soft tissues appear normal.   IMPRESSION: Explanation for patient's right shoulder and humerus pain.     Past Medical History:  Diagnosis Date   Allergy    Frequent headaches    GERD (gastroesophageal reflux disease)    H/O sinus surgery    [name] past surgical  history on file. Social History   Socioeconomic History   Marital status: Married    Spouse name: Not on file   Number of children: 3   Years of education: Not on file   Highest education level: Master's degree (e.g., MA, MS, MEng, MEd, MSW, MBA)  Occupational History   Not on file  Tobacco Use   Smoking status: Never   Smokeless tobacco: Never  Substance and Sexual Activity   Alcohol use: Yes   Drug use: [name]   Sexual activity: Yes    Birth control/protection: I.U.D.  Other Topics Concern   Not on file  Social History Narrative   Not on file   Social Determinants of Health   Financial Resource Strain: Not on file  Food Insecurity: Not on file  Transportation Needs: Not on file  Physical Activity: Not on file  Stress: Not on file  Social Connections: Not on file   Allergies  Allergen Reactions   Amoxicillin    Family History  Problem Relation Age of Onset   Asthma Mother    Hyperlipidemia Mother    Cancer Maternal Grandmother    Hypertension Maternal Grandmother    Kidney disease Maternal Grandmother    Cancer Maternal Grandfather    Early death Paternal Grandmother    Diabetes Paternal Grandfather    Arthritis Paternal Grandfather    Hypertension Paternal Grandfather      Current Outpatient Medications (Cardiovascular):    furosemide (LASIX) 20 MG tablet, Take 1 tablet (20 mg total) by mouth daily as needed. (Patient taking differently: Take 20 mg by  mouth daily as needed for edema.)  Current Outpatient Medications (Respiratory):    loratadine (CLARITIN) 10 MG tablet, Take 1 tablet (10 mg total) by mouth daily. (Patient taking differently: Take 10 mg by mouth daily as needed.)    Current Outpatient Medications (Other):    Cholecalciferol (VITAMIN D3) 2000 units capsule, Take 1 capsule (2,000 Units total) by mouth daily.   tiZANidine (ZANAFLEX) 2 MG tablet, Take 1 tablet (2 mg total) by mouth at bedtime.   Reviewed prior external information including  notes and imaging from  primary care provider As well as notes that were available from care everywhere and other healthcare systems.  Past medical history, social, surgical and family history all reviewed in electronic medical record.  [name] pertanent information unless stated regarding to the chief complaint.   Review of Systems:  [name] headache, visual changes, nausea, vomiting, diarrhea, constipation, dizziness, abdominal pain, skin rash, fevers, chills, night sweats, weight loss, swollen lymph nodes, body aches, joint swelling, chest pain, shortness of breath, mood changes. POSITIVE muscle aches  Objective  Blood pressure 110/78, pulse 72, height 5\' 5"  (1.651 m), weight 140 lb (63.5 kg), SpO2 92 %.   General: [name] apparent distress alert and oriented x3 mood and affect normal, dressed appropriately.  HEENT: Pupils equal, extraocular movements intact  Respiratory: Patient's speak in full sentences and does not appear short of breath  Cardiovascular: [name] lower extremity edema, non tender, [name] erythema  Gait normal with good balance and coordination.  MSK: Back exam does have some loss of lordosis.  Patient does have more tightness noted in the right parascapular region.  Right shoulder does have some very mild positive impingement and some mild discomfort with full external range of motion but full range of motion is noted.  Patient does have some mild positive impingement of the shoulder but rotator cuff strength is intact.   Limited muscular skeletal ultrasound was performed and interpreted by Hulan Saas, M  Limited ultrasound of patient's right shoulder still shows some very mild degenerative changes of the supraspinatus.  [name] significant numbness are noted. Impression: Overall significant decrease in hypoechoic changes from previous exam.  Osteopathic findings C2 flexed rotated and side bent right C4 flexed rotated and side bent left C7 flexed rotated and side bent left T3 extended  rotated and side bent right inhaled third rib T6 extended rotated and side bent left L2 flexed rotated and side bent right Sacrum right on right    Impression and Recommendations:     The above documentation has been reviewed and is accurate and complete [name], DO

## 2021-02-05 ENCOUNTER — Ambulatory Visit: Payer: 59 | Admitting: Family Medicine

## 2021-02-05 ENCOUNTER — Other Ambulatory Visit: Payer: Self-pay

## 2021-02-05 ENCOUNTER — Encounter: Payer: Self-pay | Admitting: Family Medicine

## 2021-02-05 ENCOUNTER — Ambulatory Visit: Payer: Self-pay

## 2021-02-05 VITALS — BP 110/78 | HR 72 | Ht 65.0 in | Wt 140.0 lb

## 2021-02-05 DIAGNOSIS — M545 Low back pain, unspecified: Secondary | ICD-10-CM

## 2021-02-05 DIAGNOSIS — G8929 Other chronic pain: Secondary | ICD-10-CM

## 2021-02-05 DIAGNOSIS — M25511 Pain in right shoulder: Secondary | ICD-10-CM | POA: Diagnosis not present

## 2021-02-05 DIAGNOSIS — M9908 Segmental and somatic dysfunction of rib cage: Secondary | ICD-10-CM | POA: Diagnosis not present

## 2021-02-05 DIAGNOSIS — M9901 Segmental and somatic dysfunction of cervical region: Secondary | ICD-10-CM

## 2021-02-05 DIAGNOSIS — M9902 Segmental and somatic dysfunction of thoracic region: Secondary | ICD-10-CM | POA: Diagnosis not present

## 2021-02-05 DIAGNOSIS — M9903 Segmental and somatic dysfunction of lumbar region: Secondary | ICD-10-CM | POA: Diagnosis not present

## 2021-02-05 DIAGNOSIS — M9904 Segmental and somatic dysfunction of sacral region: Secondary | ICD-10-CM | POA: Diagnosis not present

## 2021-02-05 MED ORDER — TIZANIDINE HCL 2 MG PO TABS: 2.0000 mg | ORAL_TABLET | Freq: Every day | ORAL | 0 refills | Status: DC

## 2021-02-05 NOTE — Assessment & Plan Note (Signed)
Seems to be making improvement.  Hold on any type of injection.  Given a muscle relaxer for some of the nighttime pain.  No significant radicular symptoms at the moment.  Discussed icing regimen and home exercises.  Increase activity slowly.  Follow-up again in 6 to 8 weeks

## 2021-02-05 NOTE — Patient Instructions (Signed)
Zanaflex 2mg  at night for next 3 nights  Stay active See me in 5-6 weeks

## 2021-02-05 NOTE — Assessment & Plan Note (Signed)
Chronic but stable.  Patient given no muscle relaxer for any of the exacerbations.  We did discuss this in greater detail.  It has been a while since we have seen patient.  Patient responded well to manipulation.  Follow-up again in 6 weeks

## 2021-03-07 ENCOUNTER — Other Ambulatory Visit: Payer: Self-pay | Admitting: Family Medicine

## 2021-03-14 NOTE — Progress Notes (Signed)
[place] Phone: [phone] Subjective:   Dawn [name], am serving as a scribe for Dr. Hulan Saas.  This visit occurred during the SARS-CoV-2 public health emergency.  Safety protocols were in place, including screening questions prior to the visit, additional usage of staff PPE, and extensive cleaning of exam room while observing appropriate contact time as indicated for disinfecting solutions.    I'm seeing this patient by the request  of:  Plotnikov, Dawn Lacks, MD  CC: back and right shoulder pain   [id number]  Dawn [name] is a 51 y.o. female coming in with complaint of back and neck pain. OMT 02/05/2021. Also f/u for R shoulder pain. Patient states that her shoulder continues to bother her. Painful with serving over superior and posterior aspect. Pain radiates into bicep and tricep. Does not radiate into the neck. Painful to grab something behind her still as well. Used muscle relaxer 2x since last visit.   Medications patient has been prescribed: Zanaflex  Taking:         Reviewed prior external information including notes and imaging from previsou exam, outside providers and external EMR if available.   As well as notes that were available from care everywhere and other healthcare systems.  Past medical history, social, surgical and family history all reviewed in electronic medical record.  [name] pertanent information unless stated regarding to the chief complaint.   Past Medical History:  Diagnosis Date   Allergy    Frequent headaches    GERD (gastroesophageal reflux disease)    H/O sinus surgery     Allergies  Allergen Reactions   Amoxicillin      Review of Systems:  [name] headache, visual changes, nausea, vomiting, diarrhea, constipation, dizziness, abdominal pain, skin rash, fevers, chills, night sweats, weight loss, swollen lymph nodes, body aches, joint swelling, chest pain,  shortness of breath, mood changes. POSITIVE muscle aches  Objective  Blood pressure 100/62, pulse 63, height 5\' 5"  (1.651 m), weight 145 lb (65.8 kg), SpO2 98 %.   General: [name] apparent distress alert and oriented x3 mood and affect normal, dressed appropriately.  HEENT: Pupils equal, extraocular movements intact  Respiratory: Patient's speak in full sentences and does not appear short of breath  Cardiovascular: [name] lower extremity edema, non tender, [name] erythema  Back exam shows patient does have loss of lordosis.  Patient does have tightness noted in the paraspinal musculature as well noted.  Positive Faber on the right side.  Tightness with straight leg test noted. Right shoulder does have positive crossover noted.  Nontender to palpation of the acromioclavicular joint.  Mild positive impingement noted.  Procedure: Real-time Ultrasound Guided Injection of right acromioclavicular joint Device: GE Logiq Q7 Ultrasound guided injection is preferred based studies that show increased duration, increased effect, greater accuracy, decreased procedural pain, increased response rate, and decreased cost with ultrasound guided versus blind injection.  Verbal informed consent obtained.  Time-out conducted.  Noted [name] overlying erythema, induration, or other signs of local infection.  Skin prepped in a sterile fashion.  Local anesthesia: Topical Ethyl chloride.  With sterile technique and under real time ultrasound guidance: With a 25-gauge half inch needle injected with 0.5 cc of 0.5% Marcaine and 0.5 cc of Kenalog 40 mg per mill Completed without difficulty  Pain immediately resolved suggesting accurate placement of the medication.  Advised to call if fevers/chills, erythema, induration, drainage, or persistent bleeding.  Impression: Technically successful ultrasound guided  injection.   Osteopathic findings  C3 flexed rotated and side bent right T3 extended rotated and side bent right inhaled  rib T9 extended rotated and side bent left L2 flexed rotated and side bent right Sacrum right on right       Assessment and Plan:  Pain in right acromioclavicular joint Patient given injection and tolerated the procedure well.  Discussed icing regimen and home exercises, which activities to do and which ones to avoid.  Discussed posture and ergonomics.  Patient likely will respond well.  Has the muscle relaxer for breakthrough.  Follow-up again in 4 to 6 weeks  Low back pain Chronic problem with tightness.  Responding well to osteopathic manipulation.  Have discussed posture and ergonomics again.  Follow-up again in 6 to 8 weeks    Nonallopathic problems  Decision today to treat with OMT was based on Physical Exam  After verbal consent patient was treated with HVLA, ME, FPR techniques in cervical, rib, thoracic, lumbar, and sacral  areas  Patient tolerated the procedure well with improvement in symptoms  Patient given exercises, stretches and lifestyle modifications  See medications in patient instructions if given  Patient will follow up in 6-8 weeks      The above documentation has been reviewed and is accurate and complete [name], DO        Note: This dictation was prepared with Dragon dictation along with smaller phrase technology. Any transcriptional errors that result from this process are unintentional.

## 2021-03-17 ENCOUNTER — Ambulatory Visit: Payer: Self-pay

## 2021-03-17 ENCOUNTER — Ambulatory Visit: Payer: 59 | Admitting: Family Medicine

## 2021-03-17 ENCOUNTER — Other Ambulatory Visit: Payer: Self-pay

## 2021-03-17 ENCOUNTER — Encounter: Payer: Self-pay | Admitting: Family Medicine

## 2021-03-17 VITALS — BP 100/62 | HR 63 | Ht 65.0 in | Wt 145.0 lb

## 2021-03-17 DIAGNOSIS — M9903 Segmental and somatic dysfunction of lumbar region: Secondary | ICD-10-CM

## 2021-03-17 DIAGNOSIS — M545 Low back pain, unspecified: Secondary | ICD-10-CM | POA: Diagnosis not present

## 2021-03-17 DIAGNOSIS — M9908 Segmental and somatic dysfunction of rib cage: Secondary | ICD-10-CM | POA: Diagnosis not present

## 2021-03-17 DIAGNOSIS — M25511 Pain in right shoulder: Secondary | ICD-10-CM

## 2021-03-17 DIAGNOSIS — M9904 Segmental and somatic dysfunction of sacral region: Secondary | ICD-10-CM | POA: Diagnosis not present

## 2021-03-17 DIAGNOSIS — M9902 Segmental and somatic dysfunction of thoracic region: Secondary | ICD-10-CM

## 2021-03-17 DIAGNOSIS — G8929 Other chronic pain: Secondary | ICD-10-CM

## 2021-03-17 DIAGNOSIS — M9901 Segmental and somatic dysfunction of cervical region: Secondary | ICD-10-CM | POA: Diagnosis not present

## 2021-03-17 NOTE — Assessment & Plan Note (Signed)
Chronic problem with tightness.  Responding well to osteopathic manipulation.  Have discussed posture and ergonomics again.  Follow-up again in 6 to 8 weeks

## 2021-03-17 NOTE — Patient Instructions (Signed)
Great to see you  Injected the [facility] joint today  Exercises 3 times a week.  Give it a couple days before tennis  Ice 20 minutes 2 times daily. Usually after activity and before bed. See me again in 4-6 weeks

## 2021-03-17 NOTE — Assessment & Plan Note (Signed)
Patient given injection and tolerated the procedure well.  Discussed icing regimen and home exercises, which activities to do and which ones to avoid.  Discussed posture and ergonomics.  Patient likely will respond well.  Has the muscle relaxer for breakthrough.  Follow-up again in 4 to 6 weeks

## 2021-03-20 ENCOUNTER — Other Ambulatory Visit: Payer: Self-pay | Admitting: Family Medicine

## 2021-04-16 NOTE — Progress Notes (Signed)
?Charlann Boxer D.O. ?[facility] ?[place] ?Phone: [phone] ?Subjective:   ? ?I'm seeing this patient by the request  of:  Plotnikov, [name], MD ? ?CC: Back and neck pain follow-up as well as shoulder pain ? ?[id number]  ?Dawn [name] is a 51 y.o. female coming in with complaint of back and neck pain. OMT 03/17/2021. Also f/u for R shoulder pain. Patient states R shoulder is OK w/ ADL, but will experience increased pain w/ overhead motions and ER. Back and neck are also doing OK and will experience soreness intermittently. ? ?Medications patient has been prescribed: Zanaflex ? ?Taking: ? ? ?  ? ? ? ? ?Reviewed prior external information including notes and imaging from previsou exam, outside providers and external EMR if available.  ? ?As well as notes that were available from care everywhere and other healthcare systems. ? ?Past medical history, social, surgical and family history all reviewed in electronic medical record.  No pertanent information unless stated regarding to the chief complaint.  ? ?Past Medical History:  ?Diagnosis Date  ? Allergy   ? Frequent headaches   ? GERD (gastroesophageal reflux disease)   ? H/O sinus surgery   ?  ?Allergies  ?Allergen Reactions  ? Amoxicillin   ? ? ? ?Review of Systems: ? No headache, visual changes, nausea, vomiting, diarrhea, constipation, dizziness, abdominal pain, skin rash, fevers, chills, night sweats, weight loss, swollen lymph nodes, body aches, joint swelling, chest pain, shortness of breath, mood changes. POSITIVE muscle aches ? ?Objective  ?Blood pressure 104/72, pulse 60, height '5\' 5"'$  (1.651 m), weight 141 lb 12.8 oz (64.3 kg), SpO2 99 %. ?  ?General: No apparent distress alert and oriented x3 mood and affect normal, dressed appropriately.  ?HEENT: Pupils equal, extraocular movements intact  ?Respiratory: Patient's speak in full sentences and does not appear short of breath  ?Cardiovascular: No lower  extremity edema, non tender, no erythema  ?Neck exam does show some tightness noted on the right side of the paraspinal musculature.  Patient does have tightness in the right parascapular area as well.  Does have some limitation of left-sided sidebending and left-sided rotation of the neck. ?Right shoulder exam shows improvement in range of motion.  5 out of 5 strength of the right mild positive impingement noted with Neer and Hawkins. ? ?Limited muscular skeletal ultrasound was performed and interpreted by Hulan Saas, M  ?Limited ultrasound of the right shoulder shows patient has some mild hypoechoic changes that is consistent with a mild bursitis.  No true tearing appreciated of the meniscus at this moment.  Less hypoechoic changes of the acromioclavicular joint also noted. ?Impression: Improvement ? ?Osteopathic findings ? ?C2 flexed rotated and side bent right ?C6 flexed rotated and side bent right ?T3 extended rotated and side bent right inhaled rib ?T9 extended rotated and side bent left ?L2 flexed rotated and side bent right ?Sacrum right on right ? ? ? ? ?  ?Assessment and Plan: ? ?Right shoulder pain ?Continues to have some discomfort, still concern for potential labral pathology.  Discussed with patient about the other possibility of an intra-articular injection.  Patient wants to hold on it at the moment.  Pain is not stopping her from any activity.  Does do a lot of overhead activity.  Had significant decrease in swelling from previous exam.  Follow-up with me again 6 weeks.  ? ?Nonallopathic problems ? ?Decision today to treat with OMT was based on Physical Exam ? ?  After verbal consent patient was treated with HVLA, ME, FPR techniques in cervical, rib, thoracic, lumbar, and sacral  areas ? ?Patient tolerated the procedure well with improvement in symptoms ? ?Patient given exercises, stretches and lifestyle modifications ? ?See medications in patient instructions if given ? ?Patient will follow up in  4-8 weeks ? ?  ? ? ?The above documentation has been reviewed and is accurate and complete [name], DO ? ? ?  ? ? Note: This dictation was prepared with Dragon dictation along with smaller phrase technology. Any transcriptional errors that result from this process are unintentional.    ?  ?  ? ?

## 2021-04-22 ENCOUNTER — Other Ambulatory Visit: Payer: Self-pay

## 2021-04-22 ENCOUNTER — Ambulatory Visit: Payer: 59 | Admitting: Family Medicine

## 2021-04-22 VITALS — BP 104/72 | HR 60 | Ht 65.0 in | Wt 141.8 lb

## 2021-04-22 DIAGNOSIS — M25511 Pain in right shoulder: Secondary | ICD-10-CM

## 2021-04-22 DIAGNOSIS — M9904 Segmental and somatic dysfunction of sacral region: Secondary | ICD-10-CM

## 2021-04-22 DIAGNOSIS — M9902 Segmental and somatic dysfunction of thoracic region: Secondary | ICD-10-CM | POA: Diagnosis not present

## 2021-04-22 DIAGNOSIS — M9908 Segmental and somatic dysfunction of rib cage: Secondary | ICD-10-CM | POA: Diagnosis not present

## 2021-04-22 DIAGNOSIS — M9901 Segmental and somatic dysfunction of cervical region: Secondary | ICD-10-CM

## 2021-04-22 DIAGNOSIS — M9903 Segmental and somatic dysfunction of lumbar region: Secondary | ICD-10-CM | POA: Diagnosis not present

## 2021-04-22 NOTE — Patient Instructions (Addendum)
Watch shoulder but it is better ? ?See me in 6 weeks ?

## 2021-04-22 NOTE — Assessment & Plan Note (Signed)
Continues to have some discomfort, still concern for potential labral pathology.  Discussed with patient about the other possibility of an intra-articular injection.  Patient wants to hold on it at the moment.  Pain is not stopping her from any activity.  Does do a lot of overhead activity.  Had significant decrease in swelling from previous exam.  Follow-up with me again 6 weeks. ?

## 2021-06-12 ENCOUNTER — Ambulatory Visit (INDEPENDENT_AMBULATORY_CARE_PROVIDER_SITE_OTHER): Payer: 59 | Admitting: Internal Medicine

## 2021-06-12 ENCOUNTER — Encounter: Payer: Self-pay | Admitting: Internal Medicine

## 2021-06-12 VITALS — BP 124/72 | HR 69 | Temp 98.0°F | Ht 65.0 in | Wt 143.0 lb

## 2021-06-12 DIAGNOSIS — D126 Benign neoplasm of colon, unspecified: Secondary | ICD-10-CM

## 2021-06-12 DIAGNOSIS — E039 Hypothyroidism, unspecified: Secondary | ICD-10-CM

## 2021-06-12 DIAGNOSIS — G8929 Other chronic pain: Secondary | ICD-10-CM

## 2021-06-12 DIAGNOSIS — Z0000 Encounter for general adult medical examination without abnormal findings: Secondary | ICD-10-CM | POA: Diagnosis not present

## 2021-06-12 DIAGNOSIS — K635 Polyp of colon: Secondary | ICD-10-CM | POA: Insufficient documentation

## 2021-06-12 LAB — URINALYSIS
Bilirubin Urine: NEGATIVE
Hgb urine dipstick: NEGATIVE
Ketones, ur: NEGATIVE
Leukocytes,Ua: NEGATIVE
Nitrite: NEGATIVE
Specific Gravity, Urine: 1.025 (ref 1.000–1.030)
Total Protein, Urine: NEGATIVE
Urine Glucose: NEGATIVE
Urobilinogen, UA: 0.2 (ref 0.0–1.0)
pH: 6 (ref 5.0–8.0)

## 2021-06-12 LAB — COMPREHENSIVE METABOLIC PANEL
ALT: 12 U/L (ref 0–35)
AST: 16 U/L (ref 0–37)
Albumin: 4.4 g/dL (ref 3.5–5.2)
Alkaline Phosphatase: 64 U/L (ref 39–117)
BUN: 12 mg/dL (ref 6–23)
CO2: 30 mEq/L (ref 19–32)
Calcium: 9.3 mg/dL (ref 8.4–10.5)
Chloride: 107 mEq/L (ref 96–112)
Creatinine, Ser: 0.77 mg/dL (ref 0.40–1.20)
GFR: 90.51 mL/min (ref >60.00)
Glucose, Bld: 108 mg/dL — ABNORMAL HIGH (ref 70–99)
Potassium: 4.5 mEq/L (ref 3.5–5.1)
Sodium: 144 mEq/L (ref 135–145)
Total Bilirubin: 0.4 mg/dL (ref 0.2–1.2)
Total Protein: 7 g/dL (ref 6.0–8.3)

## 2021-06-12 LAB — CBC WITH DIFFERENTIAL/PLATELET
Basophils Absolute: 0.1 10*3/uL (ref 0.0–0.1)
Basophils Relative: 1.1 % (ref 0.0–3.0)
Eosinophils Absolute: 0.3 10*3/uL (ref 0.0–0.7)
Eosinophils Relative: 5 % (ref 0.0–5.0)
HCT: 38.9 % (ref 36.0–46.0)
Hemoglobin: 12.8 g/dL (ref 12.0–15.0)
Lymphocytes Relative: 34 % (ref 12.0–46.0)
Lymphs Abs: 1.7 10*3/uL (ref 0.7–4.0)
MCHC: 33 g/dL (ref 30.0–36.0)
MCV: 91.3 fl (ref 78.0–100.0)
Monocytes Absolute: 0.3 10*3/uL (ref 0.1–1.0)
Monocytes Relative: 5.7 % (ref 3.0–12.0)
Neutro Abs: 2.8 10*3/uL (ref 1.4–7.7)
Neutrophils Relative %: 54.2 % (ref 43.0–77.0)
Platelets: 246 10*3/uL (ref 150.0–400.0)
RBC: 4.26 Mil/uL (ref 3.87–5.11)
RDW: 13.7 % (ref 11.5–15.5)
WBC: 5.1 10*3/uL (ref 4.0–10.5)

## 2021-06-12 LAB — T4, FREE: Free T4: 0.81 ng/dL (ref 0.60–1.60)

## 2021-06-12 LAB — LIPID PANEL
Cholesterol: 233 mg/dL — ABNORMAL HIGH (ref 0–200)
HDL: 78.5 mg/dL (ref >39.00)
LDL Cholesterol: 143 mg/dL — ABNORMAL HIGH (ref 0–99)
NonHDL: 154.36
Total CHOL/HDL Ratio: 3
Triglycerides: 55 mg/dL (ref 0.0–149.0)
VLDL: 11 mg/dL (ref 0.0–40.0)

## 2021-06-12 LAB — T3, FREE: T3, Free: 2.7 pg/mL (ref 2.3–4.2)

## 2021-06-12 LAB — TSH: TSH: 2.64 u[IU]/mL (ref 0.35–5.50)

## 2021-06-12 NOTE — Patient Instructions (Signed)
Blue-Emu cream -- use 2-3 times a day ? ?

## 2021-06-12 NOTE — Progress Notes (Signed)
? ?Subjective:  ?Patient ID: Dawn [name], female    DOB: 05-19-1970  Age: 51 y.o. MRN: [id number] ? ?CC: physical (No concerns) ? ? ?HPI ?Dawn [name] presents for a well exam ? ?Outpatient Medications Prior to Visit  ?Medication Sig Dispense Refill  ? Cholecalciferol (VITAMIN D3) 2000 units capsule Take 1 capsule (2,000 Units total) by mouth daily. 100 capsule 3  ? furosemide (LASIX) 20 MG tablet Take 1 tablet (20 mg total) by mouth daily as needed. (Patient taking differently: Take 20 mg by mouth daily as needed for edema.) 90 tablet 0  ? loratadine (CLARITIN) 10 MG tablet Take 1 tablet (10 mg total) by mouth daily. (Patient taking differently: Take 10 mg by mouth daily as needed.) 90 tablet 3  ? tiZANidine (ZANAFLEX) 2 MG tablet TAKE 1 TABLET BY MOUTH EVERYDAY AT BEDTIME 90 tablet 1  ? ?No facility-administered medications prior to visit.  ? ? ?ROS: ?Review of Systems  ?Constitutional:  Negative for activity change, appetite change, chills, fatigue and unexpected weight change.  ?HENT:  Negative for congestion, mouth sores and sinus pressure.   ?Eyes:  Negative for visual disturbance.  ?Respiratory:  Negative for cough and chest tightness.   ?Gastrointestinal:  Negative for abdominal pain and nausea.  ?Genitourinary:  Negative for difficulty urinating, frequency and vaginal pain.  ?Musculoskeletal:  Negative for back pain and gait problem.  ?Skin:  Negative for pallor and rash.  ?Neurological:  Negative for dizziness, tremors, weakness, numbness and headaches.  ?Psychiatric/Behavioral:  Negative for confusion and sleep disturbance.   ? ?Objective:  ?BP 124/72 (BP Location: Right Arm, Patient Position: Sitting, Cuff Size: Large)   Pulse 69   Temp 98 ?F (36.7 ?C) (Oral)   Ht '5\' 5"'$  (1.651 m)   Wt 143 lb (64.9 kg)   SpO2 93%   BMI 23.80 kg/m?  ? ?BP Readings from Last 3 Encounters:  ?06/12/21 124/72  ?04/22/21 104/72  ?03/17/21 100/62  ? ? ?Wt Readings from Last 3 Encounters:  ?06/12/21 143 lb (64.9  kg)  ?04/22/21 141 lb 12.8 oz (64.3 kg)  ?03/17/21 145 lb (65.8 kg)  ? ? ?Physical Exam ?Constitutional:   ?   General: She is not in acute distress. ?   Appearance: She is well-developed.  ?HENT:  ?   Head: Normocephalic.  ?   Right Ear: External ear normal.  ?   Left Ear: External ear normal.  ?   Nose: Nose normal.  ?Eyes:  ?   General:     ?   Right eye: No discharge.     ?   Left eye: No discharge.  ?   Conjunctiva/sclera: Conjunctivae normal.  ?   Pupils: Pupils are equal, round, and reactive to light.  ?Neck:  ?   Thyroid: No thyromegaly.  ?   Vascular: No JVD.  ?   Trachea: No tracheal deviation.  ?Cardiovascular:  ?   Rate and Rhythm: Normal rate and regular rhythm.  ?   Heart sounds: Normal heart sounds.  ?Pulmonary:  ?   Effort: No respiratory distress.  ?   Breath sounds: No stridor. No wheezing.  ?Abdominal:  ?   General: Bowel sounds are normal. There is no distension.  ?   Palpations: Abdomen is soft. There is no mass.  ?   Tenderness: There is no abdominal tenderness. There is no guarding or rebound.  ?Musculoskeletal:     ?   General: No tenderness.  ?   Cervical back: Normal range  of motion and neck supple. No rigidity.  ?Lymphadenopathy:  ?   Cervical: No cervical adenopathy.  ?Skin: ?   Findings: No erythema, lesion or rash.  ?Neurological:  ?   Mental Status: She is oriented to person, place, and time.  ?   Cranial Nerves: No cranial nerve deficit.  ?   Motor: No abnormal muscle tone.  ?   Coordination: Coordination normal.  ?   Deep Tendon Reflexes: Reflexes normal.  ?Psychiatric:     ?   Behavior: Behavior normal.     ?   Thought Content: Thought content normal.     ?   Judgment: Judgment normal.  ? ? ?Lab Results  ?Component Value Date  ? WBC 5.2 06/10/2020  ? HGB 14.4 06/10/2020  ? HCT 43.3 06/10/2020  ? PLT 271.0 06/10/2020  ? GLUCOSE 99 06/10/2020  ? CHOL 245 (H) 06/10/2020  ? TRIG 102.0 06/10/2020  ? HDL 76.70 06/10/2020  ? LDLCALC 148 (H) 06/10/2020  ? ALT 26 06/10/2020  ? AST 26  06/10/2020  ? NA 140 06/10/2020  ? K 4.3 06/10/2020  ? CL 103 06/10/2020  ? CREATININE 0.74 06/10/2020  ? BUN 10 06/10/2020  ? CO2 29 06/10/2020  ? TSH 3.75 06/10/2020  ? ? ?MR Lumbar Spine Wo Contrast ? ?Result Date: 10/27/2019 ?CLINICAL DATA:  Lumbar radiculopathy. EXAM: MRI LUMBAR SPINE WITHOUT CONTRAST TECHNIQUE: Multiplanar, multisequence MR imaging of the lumbar spine was performed. No intravenous contrast was administered. COMPARISON:  Plain films October 03, 2019 FINDINGS: Segmentation:  Standard. Alignment:  Physiologic. Vertebrae: No fracture, evidence of discitis, or bone lesion. Endplate degenerative changes at L4-5 and L5-S1. Conus medullaris and cauda equina: Conus extends to the T12-L1 level. Conus and cauda equina appear normal. Paraspinal and other soft tissues: Negative. Disc levels: T12-L1: No spinal canal or neural foraminal stenosis. L1-2: Shallow disc bulge without spinal canal or neural foraminal stenosis. L2-3: Small central disc protrusion causing small indentation of the thecal sac without significant spinal canal or neural foraminal stenosis. L3-4: No spinal canal or neural foraminal stenosis. L4-5: Mild loss of disc high, left asymmetric disc bulge causing mild narrowing of the left subarticular zone without significant spinal or neural foraminal stenosis. L5-S1: Mild loss of disc high, small superiorly migrating central disc extrusion and mild facet degenerative changes without significant spinal canal or neural foraminal stenosis. IMPRESSION: 1. Mild multilevel degenerative changes of the lumbar spine without high-grade spinal canal or neural foraminal stenosis at any level. 2. Mild narrowing of the left subarticular zone at L4-L5. 3. Small superiorly migrating central disc extrusion at L5-S1 without neural impingement. Electronically Signed   By: Pedro Earls M.D.   On: 10/27/2019 09:15  ? ? ?Assessment & Plan:  ? ?Problem List Items Addressed This Visit   ? ? Well adult  exam  ?  We discussed age appropriate health related issues, including available/recomended screening tests and vaccinations. Labs were ordered to be later reviewed . All questions were answered. We discussed one or more of the following - seat belt use, use of sunscreen/sun exposure exercise, safe sex, fall risk reduction, second hand smoke exposure, firearm use and storage, seat belt use, a need for adhering to healthy diet and exercise. ?Labs were ordered. All questions were answered. ?  ?  ? Colon polyp  ?  ? ? ?No orders of the defined types were placed in this encounter. ?  ? ? ?Follow-up: Return for a follow-up visit. ? ?Walker Kehr,  MD ?

## 2021-06-12 NOTE — Assessment & Plan Note (Signed)

## 2021-06-12 NOTE — Assessment & Plan Note (Signed)
Blue-Emu cream -- use 2-3 times a day ? ?

## 2021-06-17 NOTE — Progress Notes (Signed)
?Dawn [name] D.O. ?[facility] ?[place] ?Phone: [phone] ?Subjective:   ?I, Dawn [name], am serving as a scribe for Dr. Hulan Saas. ? ?This visit occurred during the SARS-CoV-2 public health emergency.  Safety protocols were in place, including screening questions prior to the visit, additional usage of staff PPE, and extensive cleaning of exam room while observing appropriate contact time as indicated for disinfecting solutions.  ? ? ?I'm seeing this patient by the request  of:  Plotnikov, [name], MD ? ?CC: Right shoulder pain, neck pain back pain ? ?[id number]  ?Dawn [name] is a 51 y.o. female coming in with complaint of back and neck pain. OMT 04/22/2021. Patient states that she feels stiff this morning.  ? ?Pain in R shoulder runs down tricep and wraps around her upper arm. Will have spasms and sharp pain with serving and forhand shots if she extends her shoulder.  ? ?Medications patient has been prescribed: None ? ?Taking: ? ? ?  ? ? ? ? ?Reviewed prior external information including notes and imaging from previsou exam, outside providers and external EMR if available.  ? ?As well as notes that were available from care everywhere and other healthcare systems. ? ?Past medical history, social, surgical and family history all reviewed in electronic medical record.  No pertanent information unless stated regarding to the chief complaint.  ? ?Past Medical History:  ?Diagnosis Date  ? Allergy   ? Frequent headaches   ? GERD (gastroesophageal reflux disease)   ? H/O sinus surgery   ?  ?Allergies  ?Allergen Reactions  ? Amoxicillin   ? ? ? ?Review of Systems: ? No headache, visual changes, nausea, vomiting, diarrhea, constipation, dizziness, abdominal pain, skin rash, fevers, chills, night sweats, weight loss, swollen lymph nodes, body aches, joint swelling, chest pain, shortness of breath, mood changes. POSITIVE muscle aches ? ?Objective  ?Blood pressure  100/64, pulse 73, height '5\' 5"'$  (1.651 m), weight 143 lb (64.9 kg), SpO2 97 %. ?  ?General: No apparent distress alert and oriented x3 mood and affect normal, dressed appropriately.  ?HEENT: Pupils equal, extraocular movements intact  ?Respiratory: Patient's speak in full sentences and does not appear short of breath  ?Cardiovascular: No lower extremity edema, non tender, no erythema  ?Shoulder: Right ?Inspection reveals no abnormalities, atrophy or asymmetry. ?Palpation is normal with no tenderness over AC joint or bicipital groove. ?ROM is full in all planes passively. ?Rotator cuff strength normal throughout. ?signs of impingement with positive Neer and Hawkin's tests, but negative empty can sign. ?Speeds and Yergason's tests normal. ?No labral pathology noted with negative Obrien's, negative clunk and good stability. ?Normal scapular function observed. ?No painful arc and no drop arm sign. ?No apprehension sign ? ?MSK US performed of: Right ?This study was ordered, performed, and interpreted by Dawn [name] D.O. ? ?Shoulder:   ?Supraspinatus:  Appears normal on long and transverse views, Bursal bulge seen with shoulder abduction on impingement view. ?Subscapularis:  Appears normal on long and transverse views. Positive bursa ?AC joint:  Capsule undistended, no geyser sign. ?Biceps Tendon:  Appears normal on long and transverse views, no fraying of tendon, tendon located in intertubercular groove, no subluxation with shoulder internal or external rotation. ? ?Impression: Subacromial bursitis ? ?Procedure: Real-time Ultrasound Guided Injection of right subacromial space ?Device: GE Logiq E  ?Ultrasound guided injection is preferred based studies that show increased duration, increased effect, greater accuracy, decreased procedural pain, increased response rate with ultrasound guided  versus blind injection.  ?Verbal informed consent obtained.  ?Time-out conducted.  ?Noted no overlying erythema, induration, or other  signs of local infection.  ?Skin prepped in a sterile fashion.  ?Local anesthesia: Topical Ethyl chloride.  ?With sterile technique and under real time ultrasound guidance:  Joint visualized.  23g 1 ? inch needle inserted posterior approach. Pictures taken for needle placement. Patient did have injection of  2 cc of 0.5% Marcaine, and 1.0 cc of Kenalog 40 mg/dL. ?Completed without difficulty  ?Pain immediately resolved suggesting accurate placement of the medication.  ?Advised to call if fevers/chills, erythema, induration, drainage, or persistent bleeding.  ?Impression: Technically successful ultrasound guided injection. ? ?Osteopathic findings ? ?C2 flexed rotated and side bent right ?C5 flexed rotated and side bent right ?T3 extended rotated and side bent right inhaled rib ?T9 extended rotated and side bent left ?L2 flexed rotated and side bent right ?Sacrum right on right ? ? ? ? ?  ?Assessment and Plan: ? ?Right shoulder pain ?Patient given injection and tolerated the procedure well.  This seems to be more of a subacromial bursitis.  No significant weakness noted.  Discussed icing regimen and home exercises, discussed which activities to do which ones to avoid, increase activity slowly.  Follow-up again in 6 to 8 weeks otherwise.  Worsening pain will need to consider advanced imaging ? ?Low back pain ?Chronic, patient is very active.  Continue to increase activity slowly.  Discussed icing regimen and home exercises, discussed which activities to do and which ones to avoid, increase activity slowly.  Follow-up again in 6 to 8 weeks  ? ?Nonallopathic problems ? ?Decision today to treat with OMT was based on Physical Exam ? ?After verbal consent patient was treated with HVLA, ME, FPR techniques in cervical, rib, thoracic, lumbar, and sacral  areas ? ?Patient tolerated the procedure well with improvement in symptoms ? ?Patient given exercises, stretches and lifestyle modifications ? ?See medications in patient  instructions if given ? ?Patient will follow up in 4-8 weeks ? ?  ? ? ?The above documentation has been reviewed and is accurate and complete [name], DO ? ? ? ?  ? ? Note: This dictation was prepared with Dragon dictation along with smaller phrase technology. Any transcriptional errors that result from this process are unintentional.    ?  ?  ? ?

## 2021-06-18 ENCOUNTER — Ambulatory Visit: Payer: 59 | Admitting: Family Medicine

## 2021-06-18 ENCOUNTER — Encounter: Payer: Self-pay | Admitting: Family Medicine

## 2021-06-18 ENCOUNTER — Ambulatory Visit: Payer: Self-pay

## 2021-06-18 VITALS — BP 100/64 | HR 73 | Ht 65.0 in | Wt 143.0 lb

## 2021-06-18 DIAGNOSIS — M25511 Pain in right shoulder: Secondary | ICD-10-CM

## 2021-06-18 DIAGNOSIS — M9904 Segmental and somatic dysfunction of sacral region: Secondary | ICD-10-CM

## 2021-06-18 DIAGNOSIS — M9903 Segmental and somatic dysfunction of lumbar region: Secondary | ICD-10-CM

## 2021-06-18 DIAGNOSIS — M9901 Segmental and somatic dysfunction of cervical region: Secondary | ICD-10-CM | POA: Diagnosis not present

## 2021-06-18 DIAGNOSIS — M9902 Segmental and somatic dysfunction of thoracic region: Secondary | ICD-10-CM

## 2021-06-18 DIAGNOSIS — G8929 Other chronic pain: Secondary | ICD-10-CM

## 2021-06-18 DIAGNOSIS — M9908 Segmental and somatic dysfunction of rib cage: Secondary | ICD-10-CM | POA: Diagnosis not present

## 2021-06-18 DIAGNOSIS — M545 Low back pain, unspecified: Secondary | ICD-10-CM

## 2021-06-18 NOTE — Patient Instructions (Addendum)
Injection in shoulder today ?Good to see you! ?Take it a little easy for 2 days then start the exercises ?See you again in 5-6 weeks ?

## 2021-06-18 NOTE — Assessment & Plan Note (Signed)
Chronic, patient is very active.  Continue to increase activity slowly.  Discussed icing regimen and home exercises, discussed which activities to do and which ones to avoid, increase activity slowly.  Follow-up again in 6 to 8 weeks ?

## 2021-06-18 NOTE — Assessment & Plan Note (Signed)
Patient given injection and tolerated the procedure well.  This seems to be more of a subacromial bursitis.  No significant weakness noted.  Discussed icing regimen and home exercises, discussed which activities to do which ones to avoid, increase activity slowly.  Follow-up again in 6 to 8 weeks otherwise.  Worsening pain will need to consider advanced imaging ?

## 2021-07-22 ENCOUNTER — Encounter: Payer: Self-pay | Admitting: Internal Medicine

## 2021-07-22 ENCOUNTER — Ambulatory Visit: Payer: 59 | Admitting: Internal Medicine

## 2021-07-22 VITALS — BP 120/76 | HR 80 | Temp 98.3°F | Ht 65.0 in | Wt 143.8 lb

## 2021-07-22 DIAGNOSIS — E559 Vitamin D deficiency, unspecified: Secondary | ICD-10-CM

## 2021-07-22 DIAGNOSIS — K140 Glossitis: Secondary | ICD-10-CM | POA: Diagnosis not present

## 2021-07-22 DIAGNOSIS — J069 Acute upper respiratory infection, unspecified: Secondary | ICD-10-CM

## 2021-07-22 MED ORDER — TRIAMCINOLONE ACETONIDE 0.1 % MT PSTE: 1.0000 | PASTE | Freq: Two times a day (BID) | OROMUCOSAL | 12 refills | Status: AC

## 2021-07-22 MED ORDER — DOXYCYCLINE HYCLATE 100 MG PO TABS: 100.0000 mg | ORAL_TABLET | Freq: Two times a day (BID) | ORAL | 0 refills | Status: DC

## 2021-07-22 NOTE — Patient Instructions (Signed)
Please take all new medication as prescribed - the antibiotic, and the kenalog in orabase paste for the traumatic tongue ulceration  Please consider wearing a mouth guard or mouth appliance at night to help keep the tongue from pressing on teeth and leading to injury  Please continue all other medications as before, and refills have been done if requested.  Please have the pharmacy call with any other refills you may need.  Please keep your appointments with your specialists as you may have planned

## 2021-07-23 NOTE — Progress Notes (Signed)
[place] Phone: [phone] Subjective:   Dawn Dawn [name], am serving as a scribe for Dr. Hulan Saas.   I'm seeing this patient by the request  of:  Plotnikov, [name], MD  CC: Neck pain and shoulder pain and back pain follow-up  [id number]  Dawn Dawn [name] is a 51 y.o. female coming in with complaint of back and neck pain. OMT 06/18/2021. Patient states that her R shoulder is not getting any better. Pain is radiating into middle deltoid. Hard to hook bra. Painful to serve in tennis so has not been hitting anything overhead.  Starting to affect her daily activities on a more regular basis.  Medications patient has been prescribed: None  Taking:         Reviewed prior external information including notes and imaging from previsou exam, outside providers and external EMR if available.   As well as notes that were available from care everywhere and other healthcare systems.  Past medical history, social, surgical and family history all reviewed in electronic medical record.  Dawn [name] pertanent information unless stated regarding to the chief complaint.   Past Medical History:  Diagnosis Date   Allergy    Frequent headaches    GERD (gastroesophageal reflux disease)    H/O sinus surgery     Allergies  Allergen Reactions   Amoxicillin      Review of Systems:  Dawn [name] headache, visual changes, nausea, vomiting, diarrhea, constipation, dizziness, abdominal pain, skin rash, fevers, chills, night sweats, weight loss, swollen lymph nodes, body aches, joint swelling, chest pain, shortness of breath, mood changes. POSITIVE muscle aches  Objective  Blood pressure 98/62, pulse 68, height '5\' 5"'$  (1.651 m), weight 143 lb (64.9 kg), SpO2 98 %.   General: Dawn [name] apparent distress alert and oriented x3 mood and affect normal, dressed appropriately.  HEENT: Pupils equal, extraocular movements intact  Respiratory:  Patient's speak in full sentences and does not appear short of breath  Cardiovascular: Dawn [name] lower extremity edema, non tender, Dawn [name] erythema  Gait MSK:  Back does have some loss of lordosis.  Significant tightness of the hip flexors bilaterally.  This seems to be more worse than patient's previous exam. Right shoulder exam shows the patient does have a positive impingement noted.  Positive O'Brien's noted.  Dawn [name] rotator cuff is still seems to have 5 out of 5 strength but patient does have pain with empty can.  Osteopathic findings  C2 flexed rotated and side bent right C5 flexed rotated and side bent left T3 extended rotated and side bent right inhaled rib L3 flexed rotated and side bent right Sacrum right on right       Assessment and Plan:  Right shoulder pain Patient's right shoulder pain Dawn [name] longer is making any significant improvement at this time. I would like patient to get an MR arthrogram to further evaluate.  Patient has had pain for greater than 9 months.  Patient has responded well for her other ailments with osteopathic manipulation.  Follow-up with me again after imaging to discuss treatment options  Low back pain Low back exam does have some mild loss of lordosis.  Some tightness more in the lower back.  Has been playing more tennis recently.  Discussed which activities to do which ones to avoid.  Follow-up again in 6- 8 weeks     Nonallopathic problems  Decision today to treat with OMT was based on Physical Exam  After verbal consent  patient was treated with HVLA, ME, FPR techniques in cervical, rib, thoracic, lumbar, and sacral  areas  Patient tolerated the procedure well with improvement in symptoms  Patient given exercises, stretches and lifestyle modifications  See medications in patient instructions if given  Patient will follow up in 4-8 weeks    The above documentation has been reviewed and is accurate and complete [name], DO          Note:  This dictation was prepared with Dragon dictation along with smaller phrase technology. Any transcriptional errors that result from this process are unintentional.

## 2021-07-24 ENCOUNTER — Encounter: Payer: Self-pay | Admitting: Internal Medicine

## 2021-07-24 ENCOUNTER — Ambulatory Visit: Payer: 59 | Admitting: Family Medicine

## 2021-07-24 VITALS — BP 98/62 | HR 68 | Ht 65.0 in | Wt 143.0 lb

## 2021-07-24 DIAGNOSIS — M9902 Segmental and somatic dysfunction of thoracic region: Secondary | ICD-10-CM | POA: Diagnosis not present

## 2021-07-24 DIAGNOSIS — G8929 Other chronic pain: Secondary | ICD-10-CM

## 2021-07-24 DIAGNOSIS — M9904 Segmental and somatic dysfunction of sacral region: Secondary | ICD-10-CM

## 2021-07-24 DIAGNOSIS — M545 Low back pain, unspecified: Secondary | ICD-10-CM | POA: Diagnosis not present

## 2021-07-24 DIAGNOSIS — M9901 Segmental and somatic dysfunction of cervical region: Secondary | ICD-10-CM

## 2021-07-24 DIAGNOSIS — M9903 Segmental and somatic dysfunction of lumbar region: Secondary | ICD-10-CM | POA: Diagnosis not present

## 2021-07-24 DIAGNOSIS — M25511 Pain in right shoulder: Secondary | ICD-10-CM

## 2021-07-24 DIAGNOSIS — M9908 Segmental and somatic dysfunction of rib cage: Secondary | ICD-10-CM

## 2021-07-24 DIAGNOSIS — J069 Acute upper respiratory infection, unspecified: Secondary | ICD-10-CM | POA: Insufficient documentation

## 2021-07-24 NOTE — Assessment & Plan Note (Signed)
Mild to mod, for antibx course - doxycycline,  to f/u any worsening symptoms or concerns 

## 2021-07-24 NOTE — Assessment & Plan Note (Signed)
.   Last vitamin D Lab Results  Component Value Date   VD25OH 38.55 05/24/2019   Low, reminded to start oral replacement

## 2021-07-24 NOTE — Assessment & Plan Note (Signed)
With ulcer in the "mold" of her 2 lower most midline anterior teeth - I recommended mouth guard at night, also ok for kenalog in orabase prn

## 2021-07-24 NOTE — Assessment & Plan Note (Signed)
Patient's right shoulder pain no longer is making any significant improvement at this time. I would like patient to get an MR arthrogram to further evaluate.  Patient has had pain for greater than 9 months.  Patient has responded well for her other ailments with osteopathic manipulation.  Follow-up with me again after imaging to discuss treatment options

## 2021-07-24 NOTE — Patient Instructions (Signed)
MRA R shoulder [phone] Keep beating up on people on the court See me in 6-8 weeks

## 2021-07-24 NOTE — Assessment & Plan Note (Signed)
Low back exam does have some mild loss of lordosis.  Some tightness more in the lower back.  Has been playing more tennis recently.  Discussed which activities to do which ones to avoid.  Follow-up again in 6- 8 weeks

## 2021-08-14 ENCOUNTER — Ambulatory Visit
Admission: RE | Admit: 2021-08-14 | Discharge: 2021-08-14 | Disposition: A | Discharge status: Home / Self Care | Payer: 59 | Source: Ambulatory Visit | Attending: Family Medicine | Admitting: Family Medicine

## 2021-08-14 ENCOUNTER — Other Ambulatory Visit: Payer: 59

## 2021-08-14 DIAGNOSIS — M25511 Pain in right shoulder: Secondary | ICD-10-CM

## 2021-08-14 MED ORDER — IOPAMIDOL (ISOVUE-M 300) INJECTION 61%
15.0000 mL | Freq: Once | INTRAMUSCULAR | Status: AC | PRN
  Administered 2021-08-14: 15 mL via INTRA_ARTICULAR

## 2021-09-10 NOTE — Progress Notes (Unsigned)
[place] Phone: [phone] Subjective:   Dawn [name], am serving as a scribe for Dr. Hulan Saas.  I'm seeing this patient by the request  of:  [name], Dawn Lacks, MD  CC: back and neck pain   [id number]  Dawn [name] is a 51 y.o. female coming in with complaint of back and neck pain. Also f/u for R shoulder pain. OMT 07/24/2021. Patient states that her shoulder pain is the same as last visit. Painful to reach behind her. Last week she had increase in lower back pain and R scapula pain. Pain has improved. Patient also mentions pain in various other joints throughout her body.   Medications patient has been prescribed: None  Taking:  MRI 08/14/2021 R shoulder   IMPRESSION: 1. 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       Reviewed prior external information including notes and imaging from previsou exam, outside providers and external EMR if available.   As well as notes that were available from care everywhere and other healthcare systems.  Past medical history, social, surgical and family history all reviewed in electronic medical record.  [name] pertanent information unless stated regarding to the chief complaint.   Past Medical History:  Diagnosis Date   Allergy    Frequent headaches    GERD (gastroesophageal reflux disease)    H/O sinus surgery     Allergies  Allergen Reactions   Amoxicillin      Review of Systems:  [name] headache, visual changes, nausea, vomiting, diarrhea, constipation, dizziness, abdominal pain, skin rash, fevers, chills, night sweats, weight loss, swollen lymph nodes, body aches, joint swelling, chest pain, shortness of breath, mood changes. POSITIVE muscle aches  Objective  Blood pressure 96/70, pulse 64, height '5\' 5"'$  (1.651 m), weight 149 lb (67.6 kg), SpO2 97 %.   General: [name] apparent distress alert and  oriented x3 mood and affect normal, dressed appropriately.  HEENT: Pupils equal, extraocular movements intact  Respiratory: Patient's speak in full sentences and does not appear short of breath  Cardiovascular: [name] lower extremity edema, non tender, [name] erythema  Gait normal MSK:  Back' \\does'$  have some tenderness to palpation noted.  Patient has more pain in the parascapular region right greater than left.  Patient does have some limited sidebending on the right side.  Right shoulder exam still shows some mild positive impingement noted.  Rotator cuff strength is intact.  Patient does have mild positive crossover noted today.  Osteopathic findings  C2 flexed rotated and side bent right C6 flexed rotated and side bent left T3 extended rotated and side bent right inhaled rib T9 extended rotated and side bent left L2 flexed rotated and side bent right Sacrum right on right  Procedure: Real-time Ultrasound Guided Injection of right acromioclavicular joint Device: GE Logiq Q7 Ultrasound guided injection is preferred based studies that show increased duration, increased effect, greater accuracy, decreased procedural pain, increased response rate, and decreased cost with ultrasound guided versus blind injection.  Verbal informed consent obtained.  Time-out conducted.  Noted [name] overlying erythema, induration, or other signs of local infection.  Skin prepped in a sterile fashion.  Local anesthesia: Topical Ethyl chloride.  With sterile technique and under real time ultrasound guidance: With a 25-gauge half inch needle injected with 0.5 cc of 0.5% Marcaine and 0.5 cc of Kenalog 40 mg/mL Completed without difficulty  Advised to call if fevers/chills, erythema, induration,  drainage, or persistent bleeding.  Impression: Technically successful ultrasound guided injection.   Assessment and Plan:  Pain in right acromioclavicular joint Injection given today, tolerated the procedure well, hopeful that  this will make significant improvement.  Discussed which activities to do and which ones to avoid.  MRI only showed some mild tendinitis of the shoulder otherwise.  Follow-up with me again in 6 to 8 weeks.    Nonallopathic problems  Decision today to treat with OMT was based on Physical Exam  After verbal consent patient was treated with HVLA, ME, FPR techniques in cervical, rib, thoracic, lumbar, and sacral  areas  Patient tolerated the procedure well with improvement in symptoms  Patient given exercises, stretches and lifestyle modifications  See medications in patient instructions if given  Patient will follow up in 4-8 weeks     The above documentation has been reviewed and is accurate and complete [name], DO         Note: This dictation was prepared with Dragon dictation along with smaller phrase technology. Any transcriptional errors that result from this process are unintentional.

## 2021-09-11 ENCOUNTER — Ambulatory Visit: Payer: Self-pay

## 2021-09-11 ENCOUNTER — Ambulatory Visit: Payer: 59 | Admitting: Family Medicine

## 2021-09-11 VITALS — BP 96/70 | HR 64 | Ht 65.0 in | Wt 149.0 lb

## 2021-09-11 DIAGNOSIS — M9903 Segmental and somatic dysfunction of lumbar region: Secondary | ICD-10-CM | POA: Diagnosis not present

## 2021-09-11 DIAGNOSIS — M25511 Pain in right shoulder: Secondary | ICD-10-CM | POA: Diagnosis not present

## 2021-09-11 DIAGNOSIS — M9902 Segmental and somatic dysfunction of thoracic region: Secondary | ICD-10-CM

## 2021-09-11 DIAGNOSIS — M9908 Segmental and somatic dysfunction of rib cage: Secondary | ICD-10-CM | POA: Diagnosis not present

## 2021-09-11 DIAGNOSIS — M9901 Segmental and somatic dysfunction of cervical region: Secondary | ICD-10-CM

## 2021-09-11 DIAGNOSIS — M545 Low back pain, unspecified: Secondary | ICD-10-CM

## 2021-09-11 DIAGNOSIS — M9904 Segmental and somatic dysfunction of sacral region: Secondary | ICD-10-CM | POA: Diagnosis not present

## 2021-09-11 DIAGNOSIS — M2550 Pain in unspecified joint: Secondary | ICD-10-CM

## 2021-09-11 DIAGNOSIS — G8929 Other chronic pain: Secondary | ICD-10-CM

## 2021-09-11 LAB — CBC WITH DIFFERENTIAL/PLATELET
Basophils Absolute: 0.1 10*3/uL (ref 0.0–0.1)
Basophils Relative: 2 % (ref 0.0–3.0)
Eosinophils Absolute: 0.3 10*3/uL (ref 0.0–0.7)
Eosinophils Relative: 7.5 % — ABNORMAL HIGH (ref 0.0–5.0)
HCT: 40.2 % (ref 36.0–46.0)
Hemoglobin: 13.2 g/dL (ref 12.0–15.0)
Lymphocytes Relative: 41.3 % (ref 12.0–46.0)
Lymphs Abs: 1.8 10*3/uL (ref 0.7–4.0)
MCHC: 32.8 g/dL (ref 30.0–36.0)
MCV: 92 fl (ref 78.0–100.0)
Monocytes Absolute: 0.3 10*3/uL (ref 0.1–1.0)
Monocytes Relative: 6 % (ref 3.0–12.0)
Neutro Abs: 1.9 10*3/uL (ref 1.4–7.7)
Neutrophils Relative %: 43.2 % (ref 43.0–77.0)
Platelets: 206 10*3/uL (ref 150.0–400.0)
RBC: 4.36 Mil/uL (ref 3.87–5.11)
RDW: 13.7 % (ref 11.5–15.5)
WBC: 4.4 10*3/uL (ref 4.0–10.5)

## 2021-09-11 LAB — COMPREHENSIVE METABOLIC PANEL
ALT: 15 U/L (ref 0–35)
AST: 20 U/L (ref 0–37)
Albumin: 4.5 g/dL (ref 3.5–5.2)
Alkaline Phosphatase: 82 U/L (ref 39–117)
BUN: 13 mg/dL (ref 6–23)
CO2: 30 mEq/L (ref 19–32)
Calcium: 9.4 mg/dL (ref 8.4–10.5)
Chloride: 104 mEq/L (ref 96–112)
Creatinine, Ser: 0.76 mg/dL (ref 0.40–1.20)
GFR: 91.78 mL/min (ref >60.00)
Glucose, Bld: 94 mg/dL (ref 70–99)
Potassium: 4 mEq/L (ref 3.5–5.1)
Sodium: 140 mEq/L (ref 135–145)
Total Bilirubin: 0.4 mg/dL (ref 0.2–1.2)
Total Protein: 7.2 g/dL (ref 6.0–8.3)

## 2021-09-11 LAB — C-REACTIVE PROTEIN: CRP: <1 mg/dL (ref 0.5–20.0)

## 2021-09-11 LAB — VITAMIN B12: Vitamin B-12: 660 pg/mL (ref 211–911)

## 2021-09-11 LAB — IBC PANEL
Iron: 93 ug/dL (ref 42–145)
Saturation Ratios: 21.5 % (ref 20.0–50.0)
TIBC: 432.6 ug/dL (ref 250.0–450.0)
Transferrin: 309 mg/dL (ref 212.0–360.0)

## 2021-09-11 LAB — VITAMIN D 25 HYDROXY (VIT D DEFICIENCY, FRACTURES): VITD: 35.31 ng/mL (ref 30.00–100.00)

## 2021-09-11 LAB — TSH: TSH: 3.01 u[IU]/mL (ref 0.35–5.50)

## 2021-09-11 LAB — SEDIMENTATION RATE: Sed Rate: 14 mm/hr (ref 0–20)

## 2021-09-11 LAB — URIC ACID: Uric Acid, Serum: 6.1 mg/dL (ref 2.4–7.0)

## 2021-09-11 LAB — FERRITIN: Ferritin: 28.2 ng/mL (ref 10.0–291.0)

## 2021-09-11 NOTE — Assessment & Plan Note (Signed)
Still has tightness noted.  Likely some exacerbation secondary to patient not being as active secondary to the shoulder pain as well.  Responds well though to osteopathic manipulation.  Discussed which activities to do which ones to avoid, increase activity slowly otherwise.  Follow-up again in 6 to 8 weeks

## 2021-09-11 NOTE — Assessment & Plan Note (Signed)
Injection given today, tolerated the procedure well, hopeful that this will make significant improvement.  Discussed which activities to do and which ones to avoid.  MRI only showed some mild tendinitis of the shoulder otherwise.  Follow-up with me again in 6 to 8 weeks.

## 2021-09-11 NOTE — Patient Instructions (Signed)
Injected AC joint today Lab work today See me again in 5-6 weeks

## 2021-09-13 LAB — RHEUMATOID FACTOR: Rheumatoid fact SerPl-aCnc: <14 IU/mL (ref <14)

## 2021-09-13 LAB — ANGIOTENSIN CONVERTING ENZYME: Angiotensin-Converting Enzyme: 39 U/L (ref 9–67)

## 2021-09-13 LAB — ANA: Anti Nuclear Antibody (ANA): NEGATIVE

## 2021-09-13 LAB — PTH, INTACT AND CALCIUM
Calcium: 9.3 mg/dL (ref 8.6–10.2)
PTH: 28 pg/mL (ref 16–77)

## 2021-09-13 LAB — CYCLIC CITRUL PEPTIDE ANTIBODY, IGG: Cyclic Citrullin Peptide Ab: <16 UNITS

## 2021-09-13 LAB — CALCIUM, IONIZED: Calcium, Ion: 5 mg/dL (ref 4.7–5.5)

## 2021-10-15 NOTE — Progress Notes (Signed)
[place] Phone: [phone] Subjective:   Dawn [name], am serving as a scribe for Dr. Hulan Saas.  I'm seeing this patient by the request  of:  Plotnikov, Dawn Lacks, MD  CC: back and neck pain follow up   [id number]  Dawn [name] is a 51 y.o. female coming in with complaint of back and neck pain. OMT 09/11/2021. Also f/u for R shoulder pain. Injection in [facility] jt last visit. Patient states that she is 80% better. Patient feels like her pain comes from not playing as much recently. [name] pain with ADLs.   Last week patient had increase in LBP. Pain has improved and is not as bad as last week.   Medications patient has been prescribed: None        Reviewed prior external information including notes and imaging from previsou exam, outside providers and external EMR if available.   As well as notes that were available from care everywhere and other healthcare systems.  Past medical history, social, surgical and family history all reviewed in electronic medical record.  [name] pertanent information unless stated regarding to the chief complaint.   Past Medical History:  Diagnosis Date   Allergy    Frequent headaches    GERD (gastroesophageal reflux disease)    H/O sinus surgery     Allergies  Allergen Reactions   Amoxicillin      Review of Systems:  [name] headache, visual changes, nausea, vomiting, diarrhea, constipation, dizziness, abdominal pain, skin rash, fevers, chills, night sweats, weight loss, swollen lymph nodes, body aches, joint swelling, chest pain, shortness of breath, mood changes. POSITIVE muscle aches  Objective  Blood pressure 110/80, pulse 63, height '5\' 5"'$  (1.651 m), weight 148 lb (67.1 kg), SpO2 98 %.   General: [name] apparent distress alert and oriented x3 mood and affect normal, dressed appropriately.  HEENT: Pupils equal, extraocular movements intact  Respiratory: Patient's speak  in full sentences and does not appear short of breath  Cardiovascular: [name] lower extremity edema, non tender, [name] erythema  MSK:  Back patient back does have some loss of lordosis.  Still does have more tightness noted in the left side of the parascapular region.  Patient's right shoulder still has very mild positive crossover but [name] significant improvement from previous exam.   Limited muscular skeletal ultrasound was performed and interpreted by Hulan Saas, M  Limited ultrasound of patient's right shoulder shows that patient does have significant decrease in hypoechoic changes.  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 Osteopathic findings  C2 flexed rotated and side bent right C5 flexed rotated and side bent left T3 extended rotated and side bent left inhaled rib T9 extended rotated and side bent left inhaled rib L2 flexed rotated and side bent right Sacrum right on right       Assessment and Plan:  Pain in right acromioclavicular joint Significant improvement at this time.  Significant decrease in the hypoechoic changes.  Still has the arthritic changes that we will need to continue to monitor.  Follow-up with me again in 6 to 8 weeks.    Nonallopathic problems  Decision today to treat with OMT was based on Physical Exam  After verbal consent patient was treated with HVLA, ME, FPR techniques in cervical, rib, thoracic, lumbar, and sacral  areas  Patient tolerated the procedure well with improvement in symptoms  Patient given exercises, stretches and lifestyle modifications  See  medications in patient instructions if given  Patient will follow up in 4-8 weeks    The above documentation has been reviewed and is accurate and complete Dawn Pulley, DO          Note: This dictation was prepared with Dragon dictation along with smaller phrase technology. Any transcriptional errors that result from this process are  unintentional.

## 2021-10-16 ENCOUNTER — Ambulatory Visit: Payer: Self-pay

## 2021-10-16 ENCOUNTER — Ambulatory Visit: Payer: 59 | Admitting: Family Medicine

## 2021-10-16 ENCOUNTER — Encounter: Payer: Self-pay | Admitting: Family Medicine

## 2021-10-16 VITALS — BP 110/80 | HR 63 | Ht 65.0 in | Wt 148.0 lb

## 2021-10-16 DIAGNOSIS — M9901 Segmental and somatic dysfunction of cervical region: Secondary | ICD-10-CM | POA: Diagnosis not present

## 2021-10-16 DIAGNOSIS — M9908 Segmental and somatic dysfunction of rib cage: Secondary | ICD-10-CM

## 2021-10-16 DIAGNOSIS — M9904 Segmental and somatic dysfunction of sacral region: Secondary | ICD-10-CM | POA: Diagnosis not present

## 2021-10-16 DIAGNOSIS — M25511 Pain in right shoulder: Secondary | ICD-10-CM

## 2021-10-16 DIAGNOSIS — M9903 Segmental and somatic dysfunction of lumbar region: Secondary | ICD-10-CM

## 2021-10-16 DIAGNOSIS — M9902 Segmental and somatic dysfunction of thoracic region: Secondary | ICD-10-CM | POA: Diagnosis not present

## 2021-10-16 NOTE — Assessment & Plan Note (Signed)
Significant improvement at this time.  Significant decrease in the hypoechoic changes.  Still has the arthritic changes that we will need to continue to monitor.  Follow-up with me again in 6 to 8 weeks.

## 2021-10-16 NOTE — Patient Instructions (Signed)
Good to see you Watch posture with driving See me in 6-8 weeks

## 2021-11-27 NOTE — Progress Notes (Signed)
Dawn Dawn [name] Phone: [phone] Subjective:   Dawn Dawn [name], am serving as a scribe for Dr. Hulan Saas.  I'm seeing this patient by the request  of:  Plotnikov, Dawn Lacks, MD  CC: Back and neck pain,, shoulder pain follow-up  [id number]  Dawn Dawn [name] is a 51 y.o. female coming in with complaint of back and neck pain. OMT 10/16/2021. Patient states that last week her pain increased in lumbar spine after taking dog to dog wash. Pain sore this week. Taking IBU prn.   R shoulder pain that is still occurring intermittently. Pain is more sore than painful. Pain over anterior aspect.   Medications patient has been prescribed: None  Taking:         Reviewed prior external information including notes and imaging from previsou exam, outside providers and external EMR if available.   As well as notes that were available from care everywhere and other healthcare systems.  Past medical history, social, surgical and family history all reviewed in electronic medical record.  Dawn [name] pertanent information unless stated regarding to the chief complaint.   Past Medical History:  Diagnosis Date   Allergy    Frequent headaches    GERD (gastroesophageal reflux disease)    H/O sinus surgery     Allergies  Allergen Reactions   Amoxicillin      Review of Systems:  Dawn [name] headache, visual changes, nausea, vomiting, diarrhea, constipation, dizziness, abdominal pain, skin rash, fevers, chills, night sweats, weight loss, swollen lymph nodes, body aches, joint swelling, chest pain, shortness of breath, mood changes. POSITIVE muscle aches  Objective  Blood pressure 110/72, pulse 73, height '5\' 5"'$  (1.651 m), weight 150 lb (68 kg), SpO2 97 %.   General: Dawn [name] apparent distress alert and oriented x3 mood and affect normal, dressed appropriately.  HEENT: Pupils equal, extraocular movements intact  Respiratory: Patient's speak in  full sentences and does not appear short of breath  Cardiovascular: Dawn [name] lower extremity edema, non tender, Dawn [name] erythema  Gait MSK:  Back back does have some loss lordosis.  Does have some tightness noted on the right paraspinal musculature.  Seems to be more intense than what would be anticipated.  Osteopathic findings  C2 flexed rotated and side bent right C5 flexed rotated and side bent left T3 extended rotated and side bent right inhaled rib T9 extended rotated and side bent left L3 flexed rotated and side bent left Sacrum right on right       Assessment and Plan:  Right shoulder pain Seems to be doing better at this moment.  Seems to still be multifactorial.  Some seems to be secondary to patient's muscle imbalances and scapular dyskinesis and likely some inflammation in the shoulder as well.  Have gotten the MRI previously has responded well though to the injection.  Discussed which activities to do and which ones to avoid.  Follow-up with me again in 6 to 8 weeks to further evaluate to make sure she continues to improve  Pain in right acromioclavicular joint Significant improvement after the injection.  Known pain and effusion of this joint that we will need to continue to monitor.  Hopefully we will only need to do this once or twice a year with patient being so active.  Likely patient is feeling really good at the moment.    Nonallopathic problems  Decision today to treat with OMT was based on Physical Exam  After verbal consent patient  was treated with HVLA, ME, FPR techniques in cervical, rib, thoracic, lumbar, and sacral  areas  Patient tolerated the procedure well with improvement in symptoms  Patient given exercises, stretches and lifestyle modifications  See medications in patient instructions if given  Patient will follow up in 4-8 weeks    The above documentation has been reviewed and is accurate and complete Dawn Pulley, DO          Note: This  dictation was prepared with Dragon dictation along with smaller phrase technology. Any transcriptional errors that result from this process are unintentional.

## 2021-12-01 ENCOUNTER — Ambulatory Visit: Payer: 59 | Admitting: Family Medicine

## 2021-12-01 ENCOUNTER — Encounter: Payer: Self-pay | Admitting: Family Medicine

## 2021-12-01 VITALS — BP 110/72 | HR 73 | Ht 65.0 in | Wt 150.0 lb

## 2021-12-01 DIAGNOSIS — M9904 Segmental and somatic dysfunction of sacral region: Secondary | ICD-10-CM | POA: Diagnosis not present

## 2021-12-01 DIAGNOSIS — M9903 Segmental and somatic dysfunction of lumbar region: Secondary | ICD-10-CM | POA: Diagnosis not present

## 2021-12-01 DIAGNOSIS — M9908 Segmental and somatic dysfunction of rib cage: Secondary | ICD-10-CM | POA: Diagnosis not present

## 2021-12-01 DIAGNOSIS — M9901 Segmental and somatic dysfunction of cervical region: Secondary | ICD-10-CM | POA: Diagnosis not present

## 2021-12-01 DIAGNOSIS — M25511 Pain in right shoulder: Secondary | ICD-10-CM

## 2021-12-01 DIAGNOSIS — M9902 Segmental and somatic dysfunction of thoracic region: Secondary | ICD-10-CM

## 2021-12-01 DIAGNOSIS — G8929 Other chronic pain: Secondary | ICD-10-CM

## 2021-12-01 NOTE — Assessment & Plan Note (Signed)
Seems to be doing better at this moment.  Seems to still be multifactorial.  Some seems to be secondary to patient's muscle imbalances and scapular dyskinesis and likely some inflammation in the shoulder as well.  Have gotten the MRI previously has responded well though to the injection.  Discussed which activities to do and which ones to avoid.  Follow-up with me again in 6 to 8 weeks to further evaluate to make sure she continues to improve

## 2021-12-01 NOTE — Patient Instructions (Signed)
Good to see you Happy Birthday Kick butt on tennis court See me in 2 months

## 2021-12-01 NOTE — Assessment & Plan Note (Signed)
Significant improvement after the injection.  Known pain and effusion of this joint that we will need to continue to monitor.  Hopefully we will only need to do this once or twice a year with patient being so active.  Likely patient is feeling really good at the moment.

## 2022-01-30 NOTE — Progress Notes (Signed)
  [name] [address] [place] [address] Phone: [phone] Subjective:   [name], am serving as a scribe for Dr. Antoine Primas.  I'm seeing this patient by the request  of:  Plotnikov, Georgina Quint, MD  CC: Right shoulder neck and back pain  [id number]  Dawn [name] is a 51 y.o. female coming in with complaint of back and neck pain. OMT 12/01/2021. Also f/u for R shoulder pain.  Patient was doing better after the [facility] joint injection in September.  Patient states that his shoulder is not giving her any discomfort, has not been quite as active as it was she would like to be.  Has some more tightness but nothing that is stopping her from activity.   Objective  Blood pressure 108/76, pulse 74, height 5\' 5"  (1.651 m), weight 162 lb (73.5 kg), SpO2 96 %.   General: No apparent distress alert and oriented x3 mood and affect normal, dressed appropriately.  HEENT: Pupils equal, extraocular movements intact  Respiratory: Patient's speak in full sentences and does not appear short of breath  Cardiovascular: No lower extremity edema, non tender, no erythema  Gait MSK:  Shoulder exam shows great range of motion noted.  Neck Exam shows range of motion with tightness on the right side of the neck noted.  Back exam shows does have tightness noted more in the thoracolumbar juncture  Osteopathic findings  C3 flexed rotated and side bent right C5 flexed rotated and side bent left T3 extended rotated and side bent right inhaled rib T11 extended rotated and side bent left L1 flexed rotated and side bent right Sacrum right on right       Assessment and Plan:  Low back pain Patient is doing relatively well but still has tightness noted.  Still has some mild muscle imbalances noted.  Encouraged the patient continues to work on core strengthening.  Patient will see how patient does follow-up with me again in 6 to 8 weeks    Nonallopathic  problems  Decision today to treat with OMT was based on Physical Exam  After verbal consent patient was treated with HVLA, ME, FPR techniques in cervical, rib, thoracic, lumbar, and sacral  areas  Patient tolerated the procedure well with improvement in symptoms  Patient given exercises, stretches and lifestyle modifications  See medications in patient instructions if given  Patient will follow up in 4-8 weeks    The above documentation has been reviewed and is accurate and complete [name], DO          Note: This dictation was prepared with Dragon dictation along with smaller phrase technology. Any transcriptional errors that result from this process are unintentional.

## 2022-02-05 ENCOUNTER — Ambulatory Visit: Payer: Self-pay

## 2022-02-05 ENCOUNTER — Ambulatory Visit: Payer: 59 | Admitting: Family Medicine

## 2022-02-05 VITALS — BP 108/76 | HR 74 | Ht 65.0 in | Wt 162.0 lb

## 2022-02-05 DIAGNOSIS — M25511 Pain in right shoulder: Secondary | ICD-10-CM | POA: Diagnosis not present

## 2022-02-05 DIAGNOSIS — M545 Low back pain, unspecified: Secondary | ICD-10-CM

## 2022-02-05 DIAGNOSIS — M9908 Segmental and somatic dysfunction of rib cage: Secondary | ICD-10-CM | POA: Diagnosis not present

## 2022-02-05 DIAGNOSIS — M9902 Segmental and somatic dysfunction of thoracic region: Secondary | ICD-10-CM | POA: Diagnosis not present

## 2022-02-05 DIAGNOSIS — G8929 Other chronic pain: Secondary | ICD-10-CM

## 2022-02-05 DIAGNOSIS — M9903 Segmental and somatic dysfunction of lumbar region: Secondary | ICD-10-CM | POA: Diagnosis not present

## 2022-02-05 DIAGNOSIS — M9901 Segmental and somatic dysfunction of cervical region: Secondary | ICD-10-CM

## 2022-02-05 DIAGNOSIS — M9904 Segmental and somatic dysfunction of sacral region: Secondary | ICD-10-CM | POA: Diagnosis not present

## 2022-02-05 NOTE — Assessment & Plan Note (Signed)
Patient is doing relatively well but still has tightness noted.  Still has some mild muscle imbalances noted.  Encouraged the patient continues to work on core strengthening.  Patient will see how patient does follow-up with me again in 6 to 8 weeks

## 2022-02-05 NOTE — Patient Instructions (Signed)
Keep fingers crossed for your daughter See me in 7-8 weeks

## 2022-03-24 NOTE — Progress Notes (Unsigned)
Dawn [name] [address] [place] Phone: [phone] Subjective:   IVilma Meckel, am serving as a scribe for Dr. Hulan Saas.  I'm seeing this patient by the request  of:  Plotnikov, Dawn Lacks, MD  CC: Neck and back pain follow-up  [id number]  Dawn [name] is a 52 y.o. female coming in with complaint of back and neck pain. OMT 02/05/2022. Also f/u for R shoulder pain. Patient states doing well. Same per usual. No new concerns.  Medications patient has been prescribed: None  Taking:         Reviewed prior external information including notes and imaging from previsou exam, outside providers and external EMR if available.   As well as notes that were available from care everywhere and other healthcare systems.  Past medical history, social, surgical and family history all reviewed in electronic medical record.  No pertanent information unless stated regarding to the chief complaint.   Past Medical History:  Diagnosis Date   Allergy    Frequent headaches    GERD (gastroesophageal reflux disease)    H/O sinus surgery     Allergies  Allergen Reactions   Amoxicillin      Review of Systems:  No headache, visual changes, nausea, vomiting, diarrhea, constipation, dizziness, abdominal pain, skin rash, fevers, chills, night sweats, weight loss, swollen lymph nodes, body aches, joint swelling, chest pain, shortness of breath, mood changes. POSITIVE muscle aches  Objective  Blood pressure 98/60, pulse 81, height 5' 5"$  (1.651 m), weight 156 lb (70.8 kg), SpO2 98 %.   General: No apparent distress alert and oriented x3 mood and affect normal, dressed appropriately.  HEENT: Pupils equal, extraocular movements intact  Respiratory: Patient's speak in full sentences and does not appear short of breath  Cardiovascular: No lower extremity edema, non tender, no erythema  Neck exam does have some loss of lordosis.  Some  tenderness to palpation in the paraspinal musculature. Bilateral elbows tender to palpation over the medial aspect.  Mild positive Tinel's noted.  Good grip strength noted. Low back still has to is noted more in the thoracolumbar junction noted.  Patient does have tightness with Corky Sox right greater than left.  Osteopathic findings   C5 flexed rotated and side bent left T3 extended rotated and side bent right inhaled rib T9 extended rotated and side bent left L2 flexed rotated and side bent right Sacrum right on right       Assessment and Plan:  Bilateral elbow joint pain More medial.  Question if it is truly the tenderness that is contributing.  We discussed thicker grip, dampen her on racquet, icing regimen and home exercises.  Compression sleeves with plain.  Follow-up again in 6 to 8 weeks Total time with patient going over patient's positioning with tennis as well as her racquet 31 minutes  Nonallopathic problems  Decision today to treat with OMT was based on Physical Exam  After verbal consent patient was treated with HVLA, ME, FPR techniques in cervical, rib, thoracic, lumbar, and sacral  areas  Patient tolerated the procedure well with improvement in symptoms  Patient given exercises, stretches and lifestyle modifications  See medications in patient instructions if given  Patient will follow up in 4-8 weeks     The above documentation has been reviewed and is accurate and complete [name], DO         Note: This dictation was prepared with Dragon dictation along with smaller phrase  technology. Any transcriptional errors that result from this process are unintentional.

## 2022-03-26 ENCOUNTER — Ambulatory Visit: Payer: 59 | Admitting: Family Medicine

## 2022-03-26 VITALS — BP 98/60 | HR 81 | Ht 65.0 in | Wt 156.0 lb

## 2022-03-26 DIAGNOSIS — M9908 Segmental and somatic dysfunction of rib cage: Secondary | ICD-10-CM

## 2022-03-26 DIAGNOSIS — M9903 Segmental and somatic dysfunction of lumbar region: Secondary | ICD-10-CM | POA: Diagnosis not present

## 2022-03-26 DIAGNOSIS — M25522 Pain in left elbow: Secondary | ICD-10-CM

## 2022-03-26 DIAGNOSIS — M9904 Segmental and somatic dysfunction of sacral region: Secondary | ICD-10-CM

## 2022-03-26 DIAGNOSIS — M25521 Pain in right elbow: Secondary | ICD-10-CM | POA: Insufficient documentation

## 2022-03-26 DIAGNOSIS — M9902 Segmental and somatic dysfunction of thoracic region: Secondary | ICD-10-CM

## 2022-03-26 DIAGNOSIS — M9901 Segmental and somatic dysfunction of cervical region: Secondary | ICD-10-CM | POA: Diagnosis not present

## 2022-03-26 NOTE — Assessment & Plan Note (Signed)
More medial.  Question if it is truly the tenderness that is contributing.  We discussed thicker grip, dampen her on racquet, icing regimen and home exercises.  Compression sleeves with plain.  Follow-up again in 6 to 8 weeks

## 2022-03-26 NOTE — Patient Instructions (Addendum)
See you again in 6 weeks Wear elbow compression Thicker grip and dampener

## 2022-05-06 ENCOUNTER — Encounter: Payer: Self-pay | Admitting: Specialist

## 2022-05-06 ENCOUNTER — Other Ambulatory Visit: Payer: Self-pay | Admitting: Specialist

## 2022-05-06 DIAGNOSIS — R519 Headache, unspecified: Secondary | ICD-10-CM

## 2022-05-07 NOTE — Progress Notes (Signed)
[name] [address] [place] [address] Phone: [phone] Subjective:   Dawn [name], am serving as a scribe for Dr. Antoine [name].  I'm seeing this patient by the request  of:  Plotnikov, Dawn Quint, MD  CC: Back and neck pain follow-up  [id number]  Dawn [name] is a 52 y.o. female coming in with complaint of back and neck pain. OMT 03/26/2022. Patient states that she has been playing tennis. Feels more aches and pains due to weight gain.  Patient is starting to become more active.  Medications patient has been prescribed: None          Reviewed prior external information including notes and imaging from previsou exam, outside providers and external EMR if available.   As well as notes that were available from care everywhere and other healthcare systems.  Past medical history, social, surgical and family history all reviewed in electronic medical record.  No pertanent information unless stated regarding to the chief complaint.   Past Medical History:  Diagnosis Date   Allergy    Frequent headaches    GERD (gastroesophageal reflux disease)    H/O sinus surgery     Allergies  Allergen Reactions   Amoxicillin      Review of Systems:  No headache, visual changes, nausea, vomiting, diarrhea, constipation, dizziness, abdominal pain, skin rash, fevers, chills, night sweats, weight loss, swollen lymph nodes, body aches, joint swelling, chest pain, shortness of breath, mood changes. POSITIVE muscle aches  Objective  Blood pressure 108/78, height 5\' 5"  (1.651 m), weight 159 lb (72.1 kg).   General: No apparent distress alert and oriented x3 mood and affect normal, dressed appropriately.  HEENT: Pupils equal, extraocular movements intact  Respiratory: Patient's speak in full sentences and does not appear short of breath  Cardiovascular: No lower extremity edema, non tender, no erythema  Low back does have some loss  lordosis.  Some tenderness to palpation in the paraspinal musculature.  Osteopathic findings  C2 flexed rotated and side bent right C5 flexed rotated and side bent left T3 extended rotated and side bent right inhaled rib T5 extended rotated and side bent left L1 flexed rotated and side bent right L4 flexed rotated and side bent right Sacrum right on right       Assessment and Plan:  Right shoulder pain Patient is doing remarkably well at this time.  No other injections since May of last year.  Able to play tennis on a regular basis.  With the epicondylitis has been doing more compression as well.  Discussed with patient about home exercises which activities to do and which ones to avoid.  Increase activity slowly otherwise.  Follow-up again 6 to 8 weeks.    Nonallopathic problems  Decision today to treat with OMT was based on Physical Exam  After verbal consent patient was treated with HVLA, ME, FPR techniques in cervical, rib, thoracic, lumbar, and sacral  areas  Patient tolerated the procedure well with improvement in symptoms  Patient given exercises, stretches and lifestyle modifications  See medications in patient instructions if given  Patient will follow up in 4-8 weeks      The above documentation has been reviewed and is accurate and complete Dawn Saa, DO        Note: This dictation was prepared with Dragon dictation along with smaller phrase technology. Any transcriptional errors that result from this process are unintentional.

## 2022-05-08 ENCOUNTER — Encounter: Payer: Self-pay | Admitting: Family Medicine

## 2022-05-08 ENCOUNTER — Ambulatory Visit: Payer: 59 | Admitting: Family Medicine

## 2022-05-08 VITALS — BP 108/78 | Ht 65.0 in | Wt 159.0 lb

## 2022-05-08 DIAGNOSIS — M9904 Segmental and somatic dysfunction of sacral region: Secondary | ICD-10-CM

## 2022-05-08 DIAGNOSIS — M9908 Segmental and somatic dysfunction of rib cage: Secondary | ICD-10-CM

## 2022-05-08 DIAGNOSIS — M9901 Segmental and somatic dysfunction of cervical region: Secondary | ICD-10-CM

## 2022-05-08 DIAGNOSIS — M25511 Pain in right shoulder: Secondary | ICD-10-CM

## 2022-05-08 DIAGNOSIS — M9903 Segmental and somatic dysfunction of lumbar region: Secondary | ICD-10-CM | POA: Diagnosis not present

## 2022-05-08 DIAGNOSIS — M9902 Segmental and somatic dysfunction of thoracic region: Secondary | ICD-10-CM | POA: Diagnosis not present

## 2022-05-08 DIAGNOSIS — G8929 Other chronic pain: Secondary | ICD-10-CM

## 2022-05-08 NOTE — Patient Instructions (Signed)
Good to see you! See you again in 6-8 weeks 

## 2022-05-08 NOTE — Assessment & Plan Note (Signed)
Patient is doing remarkably well at this time.  No other injections since May of last year.  Able to play tennis on a regular basis.  With the epicondylitis has been doing more compression as well.  Discussed with patient about home exercises which activities to do and which ones to avoid.  Increase activity slowly otherwise.  Follow-up again 6 to 8 weeks.

## 2022-05-23 ENCOUNTER — Ambulatory Visit
Admission: RE | Admit: 2022-05-23 | Discharge: 2022-05-23 | Disposition: A | Discharge status: Home / Self Care | Payer: 59 | Source: Ambulatory Visit | Attending: Specialist | Admitting: Specialist

## 2022-05-23 DIAGNOSIS — R519 Headache, unspecified: Secondary | ICD-10-CM

## 2022-06-03 ENCOUNTER — Other Ambulatory Visit: Payer: 59

## 2022-06-17 NOTE — Progress Notes (Signed)
[name] [address] [place] [address] Phone: [phone] Subjective:   INadine [name], am serving as a scribe for Dr. Antoine Primas.  I'm seeing this patient by the request  of:  Plotnikov, Georgina Quint, MD  CC: Back and neck pain follow-up  [id number]  Dawn [name] is a 52 y.o. female coming in with complaint of back and neck pain. OMT 05/08/2022. Also f/u for R shoulder pain. Patient states same per usual. Pain in L leg like occasional nerve pain on the lateral side.  Medications patient has been prescribed: None  Taking:         Reviewed prior external information including notes and imaging from previsou exam, outside providers and external EMR if available.   As well as notes that were available from care everywhere and other healthcare systems.  Past medical history, social, surgical and family history all reviewed in electronic medical record.  No pertanent information unless stated regarding to the chief complaint.   Past Medical History:  Diagnosis Date   Allergy    Frequent headaches    GERD (gastroesophageal reflux disease)    H/O sinus surgery     Allergies  Allergen Reactions   Amoxicillin      Review of Systems:  No headache, visual changes, nausea, vomiting, diarrhea, constipation, dizziness, abdominal pain, skin rash, fevers, chills, night sweats, weight loss, swollen lymph nodes, body aches, joint swelling, chest pain, shortness of breath, mood changes. POSITIVE muscle aches  Objective  Blood pressure 110/60, pulse 79, height 5\' 5"  (1.651 m), weight 160 lb (72.6 kg), SpO2 94 %.   General: No apparent distress alert and oriented x3 mood and affect normal, dressed appropriately.  HEENT: Pupils equal, extraocular movements intact  Respiratory: Patient's speak in full sentences and does not appear short of breath  Cardiovascular: No lower extremity edema, non tender, no erythema  Back exam does have some  loss lordosis some mild tenderness still noted of the right sacroiliac joint.  Positive FABER test noted.  Left leg does have some mild tightness with Pearlean Brownie as well which is a little different than patient's baseline.  Still tightness noted in the scapular area and patient's right trapezius.  Negative Spurling's.    Osteopathic findings  C2 flexed rotated and side bent right C5 flexed rotated and side bent left T3 extended rotated and side bent right inhaled rib T7 extended rotated and side bent right L2 flexed rotated and side bent right Sacrum right on right     Assessment and Plan:  Lumbar radiculopathy Has been doing relatively well with the medications. Continues to stay active.  Will continue to work on hip abductor strengthening.  Discussed icing regimen and home exercises.  Increase activity is much as possible.  Follow-up with me again in 6 to 8 weeks otherwise.    Nonallopathic problems  Decision today to treat with OMT was based on Physical Exam  After verbal consent patient was treated with HVLA, ME, FPR techniques in cervical, rib, thoracic, lumbar, and sacral  areas  Patient tolerated the procedure well with improvement in symptoms  Patient given exercises, stretches and lifestyle modifications  See medications in patient instructions if given  Patient will follow up in 4-8 weeks     The above documentation has been reviewed and is accurate and complete [name], DO         Note: This dictation was prepared with Dragon dictation along with smaller phrase technology.  Any transcriptional errors that result from this process are unintentional.

## 2022-06-19 ENCOUNTER — Ambulatory Visit: Payer: 59 | Admitting: Family Medicine

## 2022-06-19 VITALS — BP 110/60 | HR 79 | Ht 65.0 in | Wt 160.0 lb

## 2022-06-19 DIAGNOSIS — M9902 Segmental and somatic dysfunction of thoracic region: Secondary | ICD-10-CM

## 2022-06-19 DIAGNOSIS — M9901 Segmental and somatic dysfunction of cervical region: Secondary | ICD-10-CM | POA: Diagnosis not present

## 2022-06-19 DIAGNOSIS — M5416 Radiculopathy, lumbar region: Secondary | ICD-10-CM | POA: Diagnosis not present

## 2022-06-19 DIAGNOSIS — M9904 Segmental and somatic dysfunction of sacral region: Secondary | ICD-10-CM

## 2022-06-19 DIAGNOSIS — M9903 Segmental and somatic dysfunction of lumbar region: Secondary | ICD-10-CM

## 2022-06-19 DIAGNOSIS — M9908 Segmental and somatic dysfunction of rib cage: Secondary | ICD-10-CM | POA: Diagnosis not present

## 2022-06-19 NOTE — Assessment & Plan Note (Signed)
Has been doing relatively well with the medications. Continues to stay active.  Will continue to work on hip abductor strengthening.  Discussed icing regimen and home exercises.  Increase activity is much as possible.  Follow-up with me again in 6 to 8 weeks otherwise.

## 2022-06-19 NOTE — Patient Instructions (Signed)
Good to see you! Keep an eye on the leg Enjoy the weather See you again in 7-8 weeks

## 2022-08-05 NOTE — Progress Notes (Deleted)
  [name] [address] [place] [address] Phone: [phone] Subjective:    I'm seeing this patient by the request  of:  Plotnikov, Georgina Quint, MD  CC:   [id number]  Dawn [name] is a 52 y.o. female coming in with complaint of back and neck pain. OMT 06/19/2022. Patient states   Medications patient has been prescribed: None  Taking:         Reviewed prior external information including notes and imaging from previsou exam, outside providers and external EMR if available.   As well as notes that were available from care everywhere and other healthcare systems.  Past medical history, social, surgical and family history all reviewed in electronic medical record.  No pertanent information unless stated regarding to the chief complaint.   Past Medical History:  Diagnosis Date   Allergy    Frequent headaches    GERD (gastroesophageal reflux disease)    H/O sinus surgery     Allergies  Allergen Reactions   Amoxicillin      Review of Systems:  No headache, visual changes, nausea, vomiting, diarrhea, constipation, dizziness, abdominal pain, skin rash, fevers, chills, night sweats, weight loss, swollen lymph nodes, body aches, joint swelling, chest pain, shortness of breath, mood changes. POSITIVE muscle aches  Objective  There were no vitals taken for this visit.   General: No apparent distress alert and oriented x3 mood and affect normal, dressed appropriately.  HEENT: Pupils equal, extraocular movements intact  Respiratory: Patient's speak in full sentences and does not appear short of breath  Cardiovascular: No lower extremity edema, non tender, no erythema  Gait MSK:  Back   Osteopathic findings  C2 flexed rotated and side bent right C6 flexed rotated and side bent left T3 extended rotated and side bent right inhaled rib T9 extended rotated and side bent left L2 flexed rotated and side bent right Sacrum right on  right       Assessment and Plan:  No problem-specific Assessment & Plan notes found for this encounter.    Nonallopathic problems  Decision today to treat with OMT was based on Physical Exam  After verbal consent patient was treated with HVLA, ME, FPR techniques in cervical, rib, thoracic, lumbar, and sacral  areas  Patient tolerated the procedure well with improvement in symptoms  Patient given exercises, stretches and lifestyle modifications  See medications in patient instructions if given  Patient will follow up in 4-8 weeks             Note: This dictation was prepared with Dragon dictation along with smaller phrase technology. Any transcriptional errors that result from this process are unintentional.

## 2022-08-13 ENCOUNTER — Ambulatory Visit: Payer: 59 | Admitting: Family Medicine

## 2022-08-14 ENCOUNTER — Ambulatory Visit: Payer: 59 | Admitting: Family Medicine

## 2022-08-14 ENCOUNTER — Encounter: Payer: Self-pay | Admitting: Family Medicine

## 2022-08-14 VITALS — BP 102/62 | HR 73 | Ht 65.0 in | Wt 160.0 lb

## 2022-08-14 DIAGNOSIS — M9908 Segmental and somatic dysfunction of rib cage: Secondary | ICD-10-CM

## 2022-08-14 DIAGNOSIS — M545 Low back pain, unspecified: Secondary | ICD-10-CM | POA: Diagnosis not present

## 2022-08-14 DIAGNOSIS — M9904 Segmental and somatic dysfunction of sacral region: Secondary | ICD-10-CM

## 2022-08-14 DIAGNOSIS — M9901 Segmental and somatic dysfunction of cervical region: Secondary | ICD-10-CM | POA: Diagnosis not present

## 2022-08-14 DIAGNOSIS — G8929 Other chronic pain: Secondary | ICD-10-CM

## 2022-08-14 DIAGNOSIS — M9903 Segmental and somatic dysfunction of lumbar region: Secondary | ICD-10-CM

## 2022-08-14 DIAGNOSIS — M9902 Segmental and somatic dysfunction of thoracic region: Secondary | ICD-10-CM

## 2022-08-14 NOTE — Assessment & Plan Note (Signed)
Will continue to monitor but seems to be doing relatively well.  Could be having some mild inflammation on the side of the neck that we will continue to monitor as well.  Follow-up again in 6 to 8 weeks.

## 2022-08-14 NOTE — Patient Instructions (Signed)
Good to see you! Thanks for ruining my waistline See you again in 6-8 weeks

## 2022-08-14 NOTE — Progress Notes (Signed)
  [name] [address] [place] [address] Phone: [phone] Subjective:   INadine [name], am serving as a scribe for Dr. Antoine Primas.  I'm seeing this patient by the request  of:  Plotnikov, Georgina Quint, MD  CC: back and neck pain   [id number]  Dawn [name] is a 52 y.o. female coming in with complaint of back and neck pain. OMT on 06/19/2022. Patient states same per usual. No new concerns.  Medications patient has been prescribed:   Taking:         Reviewed prior external information including notes and imaging from previsou exam, outside providers and external EMR if available.   As well as notes that were available from care everywhere and other healthcare systems.  Past medical history, social, surgical and family history all reviewed in electronic medical record.  No pertanent information unless stated regarding to the chief complaint.   Past Medical History:  Diagnosis Date   Allergy    Frequent headaches    GERD (gastroesophageal reflux disease)    H/O sinus surgery     Allergies  Allergen Reactions   Amoxicillin      Review of Systems:  No headache, visual changes, nausea, vomiting, diarrhea, constipation, dizziness, abdominal pain, skin rash, fevers, chills, night sweats, weight loss, swollen lymph nodes, body aches, joint swelling, chest pain, shortness of breath, mood changes. POSITIVE muscle aches  Objective  Blood pressure 102/62, pulse 73, height 5\' 5"  (1.651 m), weight 160 lb (72.6 kg), SpO2 98%.   General: No apparent distress alert and oriented x3 mood and affect normal, dressed appropriately.  HEENT: Pupils equal, extraocular movements intact  Respiratory: Patient's speak in full sentences and does not appear short of breath  Cardiovascular: No lower extremity edema, non tender, no erythema  Back exam does have some tightness noted left greater than right.  More over the left sacroiliac joint.   Tightness in the hip adductor's also noted. Osteopathic findings  C2 flexed rotated and side bent right C7 flexed rotated and side bent left T3 extended rotated and side bent right inhaled rib T9 extended rotated and side bent left L2 flexed rotated and side bent right Sacrum left on left     Assessment and Plan:  Low back pain Will continue to monitor but seems to be doing relatively well.  Could be having some mild inflammation on the side of the neck that we will continue to monitor as well.  Follow-up again in 6 to 8 weeks.    Nonallopathic problems  Decision today to treat with OMT was based on Physical Exam  After verbal consent patient was treated with HVLA, ME, FPR techniques in cervical, rib, thoracic, lumbar, and sacral  areas  Patient tolerated the procedure well with improvement in symptoms  Patient given exercises, stretches and lifestyle modifications  See medications in patient instructions if given  Patient will follow up in 4-8 weeks     The above documentation has been reviewed and is accurate and complete [name], DO         Note: This dictation was prepared with Dragon dictation along with smaller phrase technology. Any transcriptional errors that result from this process are unintentional.

## 2022-09-17 ENCOUNTER — Encounter (INDEPENDENT_AMBULATORY_CARE_PROVIDER_SITE_OTHER): Payer: Self-pay

## 2022-09-24 NOTE — Progress Notes (Signed)
[name] [address] [place] [address] Phone: [phone] Subjective:   Dawn [name], am serving as a scribe for Dr. Antoine Primas.  I'm seeing this patient by the request  of:  Plotnikov, Georgina Quint, MD  CC: Back and neck  Follow-up  [id number]  Dawn [name] is a 52 y.o. female coming in with complaint of back and neck pain. Omt 08/14/2022. Patient states that she has been doing ok. Having some nerve pain in lateral L knee. Pain is constant and worsens when seated for a while. Was sick 2 weeks ago and spent time in bed, not working out. Still able to play tennis recently. Also said that her lower back on left side has been more painful.   Medications patient has been prescribed: None          Reviewed prior external information including notes and imaging from previsou exam, outside providers and external EMR if available.   As well as notes that were available from care everywhere and other healthcare systems.  Past medical history, social, surgical and family history all reviewed in electronic medical record.  No pertanent information unless stated regarding to the chief complaint.   Past Medical History:  Diagnosis Date   Allergy    Frequent headaches    GERD (gastroesophageal reflux disease)    H/O sinus surgery     Allergies  Allergen Reactions   Amoxicillin      Review of Systems:  No headache, visual changes, nausea, vomiting, diarrhea, constipation, dizziness, abdominal pain, skin rash, fevers, chills, night sweats, weight loss, swollen lymph nodes, body aches, joint swelling, chest pain, shortness of breath, mood changes. POSITIVE muscle aches  Objective  Blood pressure 110/78, pulse 63, height 5\' 5"  (1.651 m), weight 158 lb (71.7 kg), SpO2 98%.   General: No apparent distress alert and oriented x3 mood and affect normal, dressed appropriately.  HEENT: Pupils equal, extraocular movements intact   Respiratory: Patient's speak in full sentences and does not appear short of breath  Cardiovascular: No lower extremity edema, non tender, no erythema  Low back does have some loss lordosis noted.  Patient does have some more tightness noted on the right side of the neck as well more than usual.  Seems to be sitting region and below.  Osteopathic findings  C2 flexed rotated and side bent right C5 flexed rotated and side bent right T3 extended rotated and side bent right inhaled rib T7 extended rotated and side bent left L3 flexed rotated and side bent right Sacrum right on right    Assessment and Plan:  Lumbar radiculopathy Will continue to monitor.  Patient is off the medications at this time.  If worsening we can always go back to what worked previously.  Discussed icing regimen and home exercises.  Which activities to do and which ones to avoid.  Increase activity slowly.  Discussed which activities could cause exacerbation.  Follow-up again in 6 to 8 weeks    Nonallopathic problems  Decision today to treat with OMT was based on Physical Exam  After verbal consent patient was treated with HVLA, ME, FPR techniques in cervical, rib, thoracic, lumbar, and sacral  areas  Patient tolerated the procedure well with improvement in symptoms  Patient given exercises, stretches and lifestyle modifications  See medications in patient instructions if given  Patient will follow up in 4-8 weeks    The above documentation has been reviewed and is accurate and complete Earna Coder  Dwan Bolt, DO          Note: This dictation was prepared with Dragon dictation along with smaller phrase technology. Any transcriptional errors that result from this process are unintentional.

## 2022-09-25 ENCOUNTER — Other Ambulatory Visit: Payer: Self-pay | Admitting: Medical Genetics

## 2022-09-25 ENCOUNTER — Encounter: Payer: Self-pay | Admitting: Family Medicine

## 2022-09-25 ENCOUNTER — Ambulatory Visit: Payer: 59 | Admitting: Family Medicine

## 2022-09-25 VITALS — BP 110/78 | HR 63 | Ht 65.0 in | Wt 158.0 lb

## 2022-09-25 DIAGNOSIS — M9902 Segmental and somatic dysfunction of thoracic region: Secondary | ICD-10-CM | POA: Diagnosis not present

## 2022-09-25 DIAGNOSIS — M9901 Segmental and somatic dysfunction of cervical region: Secondary | ICD-10-CM

## 2022-09-25 DIAGNOSIS — Z006 Encounter for examination for normal comparison and control in clinical research program: Secondary | ICD-10-CM

## 2022-09-25 DIAGNOSIS — M9908 Segmental and somatic dysfunction of rib cage: Secondary | ICD-10-CM

## 2022-09-25 DIAGNOSIS — M9904 Segmental and somatic dysfunction of sacral region: Secondary | ICD-10-CM

## 2022-09-25 DIAGNOSIS — M5416 Radiculopathy, lumbar region: Secondary | ICD-10-CM | POA: Diagnosis not present

## 2022-09-25 DIAGNOSIS — M9903 Segmental and somatic dysfunction of lumbar region: Secondary | ICD-10-CM | POA: Diagnosis not present

## 2022-09-25 NOTE — Patient Instructions (Signed)
Keep working on hip and core Yoga once a week See me again in 6-8 weeks

## 2022-09-25 NOTE — Assessment & Plan Note (Signed)
Will continue to monitor.  Patient is off the medications at this time.  If worsening we can always go back to what worked previously.  Discussed icing regimen and home exercises.  Which activities to do and which ones to avoid.  Increase activity slowly.  Discussed which activities could cause exacerbation.  Follow-up again in 6 to 8 weeks

## 2022-10-13 ENCOUNTER — Encounter: Payer: 59 | Admitting: Internal Medicine

## 2022-10-28 ENCOUNTER — Ambulatory Visit (INDEPENDENT_AMBULATORY_CARE_PROVIDER_SITE_OTHER): Payer: 59 | Admitting: Internal Medicine

## 2022-10-28 VITALS — BP 110/70 | HR 68 | Temp 98.0°F | Ht 65.0 in | Wt 157.0 lb

## 2022-10-28 DIAGNOSIS — G4709 Other insomnia: Secondary | ICD-10-CM | POA: Diagnosis not present

## 2022-10-28 DIAGNOSIS — Z0000 Encounter for general adult medical examination without abnormal findings: Secondary | ICD-10-CM

## 2022-10-28 DIAGNOSIS — G43909 Migraine, unspecified, not intractable, without status migrainosus: Secondary | ICD-10-CM | POA: Insufficient documentation

## 2022-10-28 DIAGNOSIS — G4700 Insomnia, unspecified: Secondary | ICD-10-CM | POA: Insufficient documentation

## 2022-10-28 DIAGNOSIS — G43009 Migraine without aura, not intractable, without status migrainosus: Secondary | ICD-10-CM | POA: Diagnosis not present

## 2022-10-28 MED ORDER — ZOLPIDEM TARTRATE 10 MG PO TABS: 5.0000 mg | ORAL_TABLET | Freq: Every evening | ORAL | 1 refills | Status: AC | PRN

## 2022-10-28 NOTE — Assessment & Plan Note (Addendum)
F/u w/migraine clinic - Dr Neale Burly MRI brain 2024 On Imitrex prn. Other options were discussed Treat insomnia

## 2022-10-28 NOTE — Progress Notes (Signed)
Subjective:  Patient ID: Dawn [name], female    DOB: [birth year]  Age: 52 y.o. MRN: [id number]  CC: Annual Exam   HPI Dawn [name] presents for a well exam  Outpatient Medications Prior to Visit  Medication Sig Dispense Refill   Cholecalciferol (VITAMIN D3) 2000 units capsule Take 1 capsule (2,000 Units total) by mouth daily. 100 capsule 3   loratadine (CLARITIN) 10 MG tablet Take 1 tablet (10 mg total) by mouth daily. (Patient taking differently: Take 10 mg by mouth daily as needed.) 90 tablet 3   SUMAtriptan (IMITREX) 100 MG tablet Take 100 mg by mouth every 2 (two) hours as needed for migraine.     triamcinolone (KENALOG) 0.1 % paste Use as directed 1 Application in the mouth or throat 2 (two) times daily. 5 g 12   cyclobenzaprine (FLEXERIL) 10 MG tablet Take 10 mg by mouth 3 (three) times daily.     doxycycline (VIBRA-TABS) 100 MG tablet Take 1 tablet (100 mg total) by mouth 2 (two) times daily. 20 tablet 0   furosemide (LASIX) 20 MG tablet Take 1 tablet (20 mg total) by mouth daily as needed. (Patient taking differently: Take 20 mg by mouth daily as needed for edema.) 90 tablet 0   venlafaxine XR (EFFEXOR-XR) 37.5 MG 24 hr capsule Take 1 capsule by mouth daily with breakfast.     zonisamide (ZONEGRAN) 100 MG capsule Take 100 mg by mouth daily.     No facility-administered medications prior to visit.    ROS: Review of Systems  Constitutional:  Negative for activity change, appetite change, chills, fatigue and unexpected weight change.  HENT:  Negative for congestion, mouth sores and sinus pressure.   Eyes:  Negative for visual disturbance.  Respiratory:  Negative for cough and chest tightness.   Gastrointestinal:  Negative for abdominal pain and nausea.  Genitourinary:  Negative for difficulty urinating, frequency and vaginal pain.  Musculoskeletal:  Negative for back pain and gait problem.  Skin:  Negative for pallor and rash.  Neurological:  Positive for  headaches. Negative for dizziness, tremors, weakness and numbness.  Psychiatric/Behavioral:  Positive for sleep disturbance. Negative for confusion.     Objective:  BP 110/70 (BP Location: Right Arm, Patient Position: Sitting, Cuff Size: Normal)   Pulse 68   Temp 98 F (36.7 C) (Oral)   Ht 5\' 5"  (1.651 m)   Wt 157 lb (71.2 kg)   SpO2 92%   BMI 26.13 kg/m   BP Readings from Last 3 Encounters:  10/28/22 110/70  09/25/22 110/78  08/14/22 102/62    Wt Readings from Last 3 Encounters:  10/28/22 157 lb (71.2 kg)  09/25/22 158 lb (71.7 kg)  08/14/22 160 lb (72.6 kg)    Physical Exam Constitutional:      General: She is not in acute distress.    Appearance: Normal appearance. She is well-developed.  HENT:     Head: Normocephalic.     Right Ear: External ear normal.     Left Ear: External ear normal.     Nose: Nose normal.  Eyes:     General:        Right eye: No discharge.        Left eye: No discharge.     Conjunctiva/sclera: Conjunctivae normal.     Pupils: Pupils are equal, round, and reactive to light.  Neck:     Thyroid: No thyromegaly.     Vascular: No JVD.     Trachea: No tracheal deviation.  Cardiovascular:     Rate and Rhythm: Normal rate and regular rhythm.     Heart sounds: Normal heart sounds.  Pulmonary:     Effort: No respiratory distress.     Breath sounds: No stridor. No wheezing.  Abdominal:     General: Bowel sounds are normal. There is no distension.     Palpations: Abdomen is soft. There is no mass.     Tenderness: There is no abdominal tenderness. There is no guarding or rebound.  Musculoskeletal:        General: No tenderness.     Cervical back: Normal range of motion and neck supple. No rigidity.     Right lower leg: No edema.     Left lower leg: No edema.  Lymphadenopathy:     Cervical: No cervical adenopathy.  Skin:    Findings: No erythema or rash.  Neurological:     Cranial Nerves: No cranial nerve deficit.     Motor: No abnormal  muscle tone.     Coordination: Coordination normal.     Deep Tendon Reflexes: Reflexes normal.  Psychiatric:        Behavior: Behavior normal.        Thought Content: Thought content normal.        Judgment: Judgment normal.     Lab Results  Component Value Date   WBC 4.4 09/11/2021   HGB 13.2 09/11/2021   HCT 40.2 09/11/2021   PLT 206.0 09/11/2021   GLUCOSE 94 09/11/2021   CHOL 233 (H) 06/12/2021   TRIG 55.0 06/12/2021   HDL 78.50 06/12/2021   LDLCALC 143 (H) 06/12/2021   ALT 15 09/11/2021   AST 20 09/11/2021   NA 140 09/11/2021   K 4.0 09/11/2021   CL 104 09/11/2021   CREATININE 0.76 09/11/2021   BUN 13 09/11/2021   CO2 30 09/11/2021   TSH 3.01 09/11/2021    MR BRAIN WO CONTRAST  Result Date: 05/26/2022 CLINICAL DATA:  Provided history: Intractable headache, unspecified chronicity pattern, unspecified headache type. Additional history provided by the scanning technologist: The patient reports headaches for 30 years, symptoms worsening in the past one month, increased nausea. EXAM: MRI HEAD WITHOUT CONTRAST TECHNIQUE: Multiplanar, multiecho pulse sequences of the brain and surrounding structures were obtained without intravenous contrast. COMPARISON:  No pertinent prior exams available for comparison. FINDINGS: Brain: No age advanced or lobar predominant parenchymal atrophy. There are a several small nonspecific foci of T2 FLAIR hyperintense signal abnormality scattered within the bilateral cerebral white matter. No cortical encephalomalacia is identified. There is no acute infarct. No evidence of an intracranial mass. No chronic intracranial blood products. No extra-axial fluid collection. No midline shift. Vascular: Maintained flow voids within the proximal large arterial vessels. Skull and upper cervical spine: No focal suspicious marrow lesion. Sinuses/Orbits: No mass or acute finding within the imaged orbits. No significant paranasal sinus disease. IMPRESSION: 1.  No evidence  of an acute intracranial abnormality. 2. Several small nonspecific foci of T2 FLAIR hyperintense signal abnormality within the bilateral cerebral white matter. These signal changes are nonspecific and differential considerations include chronic small vessel ischemic disease, sequelae of chronic migraine headaches, sequelae of a prior infectious/inflammatory process or sequelae of demyelinating disease, among others. No lesions specifically suggest demyelinating disease. Electronically Signed   By: Jackey Loge D.O.   On: 05/26/2022 19:06    Assessment & Plan:   Problem List Items Addressed This Visit     Well adult exam - Primary  We discussed age appropriate health related issues, including available/recomended screening tests and vaccinations. Labs were ordered to be later reviewed . All questions were answered. We discussed one or more of the following - seat belt use, use of sunscreen/sun exposure exercise, safe sex, fall risk reduction, second hand smoke exposure, firearm use and storage, seat belt use, a need for adhering to healthy diet and exercise. Labs were ordered. All questions were answered. Colon Dr [address] - 2022 - polyps, due in 2027 Eye exam is due - Dr R GYN q 12 mo      Relevant Orders   TSH   Urinalysis   CBC with Differential/Platelet   Lipid panel   Comprehensive metabolic panel   Vitamin B12   VITAMIN D 25 Hydroxy (Vit-D Deficiency, Fractures)   Insomnia    Zolpidem - rare use and small dose  Potential benefits of a long term benzodiazepines  use as well as potential risks  and complications were explained to the patient and were aknowledged.       Migraines    F/u w/migraine clinic - Dr Neale Burly MRI brain 2024 On Imitrex prn. Other options were discussed Treat insomnia       Relevant Medications   SUMAtriptan (IMITREX) 100 MG tablet   Other Relevant Orders   Vitamin B12   VITAMIN D 25 Hydroxy (Vit-D Deficiency, Fractures)      Meds ordered this  encounter  Medications   zolpidem (AMBIEN) 10 MG tablet    Sig: Take 0.5-1 tablets (5-10 mg total) by mouth at bedtime as needed for sleep.    Dispense:  30 tablet    Refill:  1      Follow-up: Return in about 1 year (around 10/28/2023) for Wellness Exam.  Sonda Primes, MD

## 2022-10-28 NOTE — Assessment & Plan Note (Signed)
Zolpidem - rare use and small dose  Potential benefits of a long term benzodiazepines  use as well as potential risks  and complications were explained to the patient and were aknowledged.

## 2022-10-28 NOTE — Assessment & Plan Note (Addendum)
  We discussed age appropriate health related issues, including available/recomended screening tests and vaccinations. Labs were ordered to be later reviewed . All questions were answered. We discussed one or more of the following - seat belt use, use of sunscreen/sun exposure exercise, safe sex, fall risk reduction, second hand smoke exposure, firearm use and storage, seat belt use, a need for adhering to healthy diet and exercise. Labs were ordered. All questions were answered. Colon Dr [address] - 2022 - polyps, due in 2027 Eye exam is due - Dr R GYN q 12 mo

## 2022-10-29 LAB — URINALYSIS, ROUTINE W REFLEX MICROSCOPIC
Bilirubin Urine: NEGATIVE
Hgb urine dipstick: NEGATIVE
Ketones, ur: NEGATIVE
Nitrite: POSITIVE — AB
Specific Gravity, Urine: 1.015 (ref 1.000–1.030)
Total Protein, Urine: NEGATIVE
Urine Glucose: NEGATIVE
Urobilinogen, UA: 0.2 (ref 0.0–1.0)
pH: 6 (ref 5.0–8.0)

## 2022-10-29 LAB — COMPREHENSIVE METABOLIC PANEL
ALT: 12 U/L (ref 0–35)
AST: 18 U/L (ref 0–37)
Albumin: 4.3 g/dL (ref 3.5–5.2)
Alkaline Phosphatase: 70 U/L (ref 39–117)
BUN: 8 mg/dL (ref 6–23)
CO2: 27 mEq/L (ref 19–32)
Calcium: 9.5 mg/dL (ref 8.4–10.5)
Chloride: 107 mEq/L (ref 96–112)
Creatinine, Ser: 0.76 mg/dL (ref 0.40–1.20)
GFR: 91.05 mL/min (ref >60.00)
Glucose, Bld: 95 mg/dL (ref 70–99)
Potassium: 4.2 mEq/L (ref 3.5–5.1)
Sodium: 143 mEq/L (ref 135–145)
Total Bilirubin: 0.6 mg/dL (ref 0.2–1.2)
Total Protein: 7.1 g/dL (ref 6.0–8.3)

## 2022-10-29 LAB — CBC WITH DIFFERENTIAL/PLATELET
Basophils Absolute: 0.1 10*3/uL (ref 0.0–0.1)
Basophils Relative: 1.8 % (ref 0.0–3.0)
Eosinophils Absolute: 0.1 10*3/uL (ref 0.0–0.7)
Eosinophils Relative: 3.2 % (ref 0.0–5.0)
HCT: 39.8 % (ref 36.0–46.0)
Hemoglobin: 12.9 g/dL (ref 12.0–15.0)
Lymphocytes Relative: 43.1 % (ref 12.0–46.0)
Lymphs Abs: 1.8 10*3/uL (ref 0.7–4.0)
MCHC: 32.5 g/dL (ref 30.0–36.0)
MCV: 91.2 fl (ref 78.0–100.0)
Monocytes Absolute: 0.2 10*3/uL (ref 0.1–1.0)
Monocytes Relative: 5.9 % (ref 3.0–12.0)
Neutro Abs: 1.9 10*3/uL (ref 1.4–7.7)
Neutrophils Relative %: 46 % (ref 43.0–77.0)
Platelets: 253 10*3/uL (ref 150.0–400.0)
RBC: 4.36 Mil/uL (ref 3.87–5.11)
RDW: 14.1 % (ref 11.5–15.5)
WBC: 4.2 10*3/uL (ref 4.0–10.5)

## 2022-10-29 LAB — LIPID PANEL
Cholesterol: 221 mg/dL — ABNORMAL HIGH (ref 0–200)
HDL: 79.8 mg/dL (ref >39.00)
LDL Cholesterol: 127 mg/dL — ABNORMAL HIGH (ref 0–99)
NonHDL: 141
Total CHOL/HDL Ratio: 3
Triglycerides: 69 mg/dL (ref 0.0–149.0)
VLDL: 13.8 mg/dL (ref 0.0–40.0)

## 2022-10-29 LAB — VITAMIN D 25 HYDROXY (VIT D DEFICIENCY, FRACTURES): VITD: 39.69 ng/mL (ref 30.00–100.00)

## 2022-10-29 LAB — TSH: TSH: 1.93 u[IU]/mL (ref 0.35–5.50)

## 2022-10-29 LAB — VITAMIN B12: Vitamin B-12: 517 pg/mL (ref 211–911)

## 2022-10-30 ENCOUNTER — Other Ambulatory Visit: Payer: Self-pay | Admitting: Internal Medicine

## 2022-10-30 MED ORDER — NITROFURANTOIN MONOHYD MACRO 100 MG PO CAPS: 100.0000 mg | ORAL_CAPSULE | Freq: Two times a day (BID) | ORAL | 0 refills | Status: DC

## 2022-11-05 NOTE — Progress Notes (Signed)
[name] [address] [place] [address] Phone: [phone] Subjective:   [name], am serving as a scribe for Dr. Antoine Primas.  I'm seeing this patient by the request  of:  Plotnikov, Georgina Quint, MD  CC: Left knee follow-up  BJY:[id number]  Amye Bora is a 52 y.o. female coming in with complaint of back and neck pain. OMT 09/25/2022. Patient states that pain in her L knee over lateral aspect will increase her pain. Pain worse after sitting for long periods. No pain with lateral movements in tennis.   Overall her neck and back pain have been doing well.   Medications patient has been prescribed: None  Taking:         Reviewed prior external information including notes and imaging from previsou exam, outside providers and external EMR if available.   As well as notes that were available from care everywhere and other healthcare systems.  Past medical history, social, surgical and family history all reviewed in electronic medical record.  No pertanent information unless stated regarding to the chief complaint.   Past Medical History:  Diagnosis Date   Allergy    Frequent headaches    GERD (gastroesophageal reflux disease)    H/O sinus surgery     Allergies  Allergen Reactions   Amoxicillin    Zonegran [Zonisamide]     Bad side affect     Review of Systems:  No headache, visual changes, nausea, vomiting, diarrhea, constipation, dizziness, abdominal pain, skin rash, fevers, chills, night sweats, weight loss, swollen lymph nodes, body aches, joint swelling, chest pain, shortness of breath, mood changes. POSITIVE muscle aches  Objective  Blood pressure 106/74, pulse 61, height 5\' 5"  (1.651 m), weight 157 lb (71.2 kg), SpO2 98%.   General: No apparent distress alert and oriented x3 mood and affect normal, dressed appropriately.  HEENT: Pupils equal, extraocular movements intact  Respiratory: Patient's speak in full  sentences and does not appear short of breath  Cardiovascular: No lower extremity edema, non tender, no erythema  Left knee exam does have some tenderness to palpation minorly over the lateral aspect. Neck exam still has some mild loss of lordosis.  Tightness noted in the right parascapular area.  Tightness around the sacroiliac joint bilaterally lacking the last 5 degrees of extension  Osteopathic findings  C2 flexed rotated and side bent right C6 flexed rotated and side bent left T3 extended rotated and side bent right inhaled rib T9 extended rotated and side bent left L2 flexed rotated and side bent right Sacrum right on right       Assessment and Plan:  Popliteus tendonitis Left-sided popliteal tendinitis noted.  Discussed icing regimen and home exercises, discussed which activities to do and which ones to avoid.  Discussed with patient about the hamstring strengthening exercises.  Patient can do what ever activity she would like.  Follow-up again 6 to 8 weeks  Low back pain Discussed with patient about which activities to do.  Continue to work on core strength which patient has done remarkably well overall.  Continuing to play tennis at a very high frequency.  No changes in medication and has muscle relaxers for breakthrough follow-up again in 6 to 8 weeks otherwise.    Nonallopathic problems  Decision today to treat with OMT was based on Physical Exam  After verbal consent patient was treated with HVLA, ME, FPR techniques in cervical, rib, thoracic, lumbar, and sacral  areas  Patient tolerated  the procedure well with improvement in symptoms  Patient given exercises, stretches and lifestyle modifications  See medications in patient instructions if given  Patient will follow up in 4-8 weeks     The above documentation has been reviewed and is accurate and complete [name], DO         Note: This dictation was prepared with Dragon dictation along with  smaller phrase technology. Any transcriptional errors that result from this process are unintentional.

## 2022-11-06 ENCOUNTER — Ambulatory Visit (INDEPENDENT_AMBULATORY_CARE_PROVIDER_SITE_OTHER): Payer: 59

## 2022-11-06 ENCOUNTER — Ambulatory Visit: Payer: 59 | Admitting: Family Medicine

## 2022-11-06 ENCOUNTER — Encounter: Payer: Self-pay | Admitting: Family Medicine

## 2022-11-06 VITALS — BP 106/74 | HR 61 | Ht 65.0 in | Wt 157.0 lb

## 2022-11-06 DIAGNOSIS — M9902 Segmental and somatic dysfunction of thoracic region: Secondary | ICD-10-CM | POA: Diagnosis not present

## 2022-11-06 DIAGNOSIS — M76899 Other specified enthesopathies of unspecified lower limb, excluding foot: Secondary | ICD-10-CM | POA: Insufficient documentation

## 2022-11-06 DIAGNOSIS — M9904 Segmental and somatic dysfunction of sacral region: Secondary | ICD-10-CM | POA: Diagnosis not present

## 2022-11-06 DIAGNOSIS — M9901 Segmental and somatic dysfunction of cervical region: Secondary | ICD-10-CM | POA: Diagnosis not present

## 2022-11-06 DIAGNOSIS — M76892 Other specified enthesopathies of left lower limb, excluding foot: Secondary | ICD-10-CM | POA: Diagnosis not present

## 2022-11-06 DIAGNOSIS — G8929 Other chronic pain: Secondary | ICD-10-CM

## 2022-11-06 DIAGNOSIS — M25562 Pain in left knee: Secondary | ICD-10-CM

## 2022-11-06 DIAGNOSIS — M9908 Segmental and somatic dysfunction of rib cage: Secondary | ICD-10-CM

## 2022-11-06 DIAGNOSIS — M9903 Segmental and somatic dysfunction of lumbar region: Secondary | ICD-10-CM

## 2022-11-06 DIAGNOSIS — M545 Low back pain, unspecified: Secondary | ICD-10-CM | POA: Diagnosis not present

## 2022-11-06 NOTE — Assessment & Plan Note (Signed)
Left-sided popliteal tendinitis noted.  Discussed icing regimen and home exercises, discussed which activities to do and which ones to avoid.  Discussed with patient about the hamstring strengthening exercises.  Patient can do what ever activity she would like.  Follow-up again 6 to 8 weeks

## 2022-11-06 NOTE — Patient Instructions (Signed)
Xray today See me again in 6 weeks

## 2022-11-06 NOTE — Assessment & Plan Note (Signed)
Discussed with patient about which activities to do.  Continue to work on core strength which patient has done remarkably well overall.  Continuing to play tennis at a very high frequency.  No changes in medication and has muscle relaxers for breakthrough follow-up again in 6 to 8 weeks otherwise.

## 2022-12-14 NOTE — Progress Notes (Unsigned)
[name] [address] [place] [address] Phone: [phone] Subjective:   [name], am serving as a scribe for Dr. Antoine Primas.  I'm seeing this patient by the request  of:  Plotnikov, Georgina Quint, MD  CC: Left knee pain  BJY:[id number]  Dawn [name] is a 52 y.o. female coming in with complaint of back and neck pain. OMT on 11/06/2022. Patient states that L knee is still bothering her over lateral aspect. Has been able to rest recently due to viral infection and pain did not improve.   Medications patient has been prescribed:   Taking:         Reviewed prior external information including notes and imaging from previsou exam, outside providers and external EMR if available.   As well as notes that were available from care everywhere and other healthcare systems.  Past medical history, social, surgical and family history all reviewed in electronic medical record.  No pertanent information unless stated regarding to the chief complaint.   Past Medical History:  Diagnosis Date   Allergy    Frequent headaches    GERD (gastroesophageal reflux disease)    H/O sinus surgery     Allergies  Allergen Reactions   Amoxicillin    Zonegran [Zonisamide]     Bad side affect     Review of Systems:  No headache, visual changes, nausea, vomiting, diarrhea, constipation, dizziness, abdominal pain, skin rash, fevers, chills, night sweats, weight loss, swollen lymph nodes, body aches, joint swelling, chest pain, shortness of breath, mood changes. POSITIVE muscle aches  Objective  Blood pressure 108/72, pulse 79, height 5\' 5"  (1.651 m), weight 156 lb (70.8 kg), SpO2 96%.   General: No apparent distress alert and oriented x3 mood and affect normal, dressed appropriately.  HEENT: Pupils equal, extraocular movements intact  Respiratory: Patient's speak in full sentences and does not appear short of breath  Cardiovascular: No lower  extremity edema, non tender, no erythema  Significant swelling noted over the lateral aspect of the knee.  Seems to be better over the fibular head itself..  Possible palpable mass noted.  Osteopathic findings  C3 flexed rotated and side bent right C7 flexed rotated and side bent left T3 extended rotated and side bent right inhaled rib T9 extended rotated and side bent left L2 flexed rotated and side bent right Sacrum right on right    Assessment and Plan:  Pain and swelling of left knee Patient has taken a break previously.  Was likely to have more of a popliteal tendinitis but now having significant more superficial swelling noted today.  Discussed with patient that I do think that this is abnormal.  Seems to be more anterior to the fibular head as well.  No significant laxity noted on exam but I do feel that further evaluation is secondary to patient having pain in her daily activities, waking her up at night and worsening swelling of the as well as not responding to conservative therapy do need an MRI to further evaluate.      Nonallopathic problems  Decision today to treat with OMT was based on Physical Exam  After verbal consent patient was treated with HVLA, ME, FPR techniques in cervical, rib, thoracic, lumbar, and sacral  areas  Patient tolerated the procedure well with improvement in symptoms  Patient given exercises, stretches and lifestyle modifications  See medications in patient instructions if given  Patient will follow up in 4-8 weeks  The above documentation has been reviewed and is accurate and complete [name], DO         Note: This dictation was prepared with Dragon dictation along with smaller phrase technology. Any transcriptional errors that result from this process are unintentional.

## 2022-12-15 ENCOUNTER — Other Ambulatory Visit: Payer: Self-pay

## 2022-12-15 ENCOUNTER — Ambulatory Visit: Payer: 59 | Admitting: Family Medicine

## 2022-12-15 ENCOUNTER — Encounter: Payer: Self-pay | Admitting: Family Medicine

## 2022-12-15 VITALS — BP 108/72 | HR 79 | Ht 65.0 in | Wt 156.0 lb

## 2022-12-15 DIAGNOSIS — M9903 Segmental and somatic dysfunction of lumbar region: Secondary | ICD-10-CM | POA: Diagnosis not present

## 2022-12-15 DIAGNOSIS — M25462 Effusion, left knee: Secondary | ICD-10-CM | POA: Insufficient documentation

## 2022-12-15 DIAGNOSIS — M9901 Segmental and somatic dysfunction of cervical region: Secondary | ICD-10-CM | POA: Diagnosis not present

## 2022-12-15 DIAGNOSIS — M9904 Segmental and somatic dysfunction of sacral region: Secondary | ICD-10-CM

## 2022-12-15 DIAGNOSIS — M9908 Segmental and somatic dysfunction of rib cage: Secondary | ICD-10-CM | POA: Diagnosis not present

## 2022-12-15 DIAGNOSIS — M25562 Pain in left knee: Secondary | ICD-10-CM

## 2022-12-15 DIAGNOSIS — M9902 Segmental and somatic dysfunction of thoracic region: Secondary | ICD-10-CM | POA: Diagnosis not present

## 2022-12-15 NOTE — Patient Instructions (Addendum)
[facility]  When we receive your results we will contact you. Keep kicking butt on Tennis court Good luck with soccer this weekend See you again in 6-8 weeks

## 2022-12-15 NOTE — Assessment & Plan Note (Addendum)
Patient has taken a break previously.  Was likely to have more of a popliteal tendinitis but now having significant more superficial swelling noted today.  Discussed with patient that I do think that this is abnormal.  Seems to be more anterior to the fibular head as well.  No significant laxity noted on exam but I do feel that further evaluation is secondary to patient having pain in her daily activities, waking her up at night and worsening swelling of the as well as not responding to conservative therapy do need an MRI to further evaluate.

## 2022-12-24 ENCOUNTER — Ambulatory Visit: Payer: 59 | Admitting: Internal Medicine

## 2022-12-24 ENCOUNTER — Encounter: Payer: Self-pay | Admitting: Internal Medicine

## 2022-12-24 VITALS — BP 112/80 | HR 83 | Temp 98.3°F | Ht 65.0 in | Wt 156.8 lb

## 2022-12-24 DIAGNOSIS — J069 Acute upper respiratory infection, unspecified: Secondary | ICD-10-CM | POA: Diagnosis not present

## 2022-12-24 LAB — POCT RAPID STREP A (OFFICE): Rapid Strep A Screen: NEGATIVE

## 2022-12-24 MED ORDER — CLARITIN-D 12 HOUR 5-120 MG PO TB12: 1.0000 | ORAL_TABLET | Freq: Two times a day (BID) | ORAL | 1 refills | Status: AC | PRN

## 2022-12-24 MED ORDER — AZITHROMYCIN 250 MG PO TABS: ORAL_TABLET | ORAL | 0 refills | Status: AC

## 2022-12-24 NOTE — Addendum Note (Signed)
Addended byParticia Nearing on: 12/24/2022 10:28 AM   Modules accepted: Orders

## 2022-12-24 NOTE — Assessment & Plan Note (Signed)
Strep test Z pack Claritin D 12 h bid

## 2022-12-24 NOTE — Progress Notes (Signed)
Subjective:  Patient ID: Dawn [name], female    DOB: Jul 16, 1970  Age: 52 y.o. MRN: [id number]  CC: Ear Pain (Bilateral ear pain/fullness x1 week. Past history of seasonal allergies.) and Sore Throat (Sore throat  x1-2 days. Pain when swallowing. Treating with saline spray)   HPI Dawn [name] presents for   Outpatient Medications Prior to Visit  Medication Sig Dispense Refill   Cholecalciferol (VITAMIN D3) 2000 units capsule Take 1 capsule (2,000 Units total) by mouth daily. 100 capsule 3   zolpidem (AMBIEN) 10 MG tablet Take 0.5-1 tablets (5-10 mg total) by mouth at bedtime as needed for sleep. 30 tablet 1   loratadine (CLARITIN) 10 MG tablet Take 1 tablet (10 mg total) by mouth daily. (Patient not taking: Reported on 12/24/2022) 90 tablet 3   SUMAtriptan (IMITREX) 100 MG tablet Take 100 mg by mouth every 2 (two) hours as needed for migraine. (Patient not taking: Reported on 12/24/2022)     triamcinolone (KENALOG) 0.1 % paste Use as directed 1 Application in the mouth or throat 2 (two) times daily. (Patient not taking: Reported on 12/24/2022) 5 g 12   nitrofurantoin, macrocrystal-monohydrate, (MACROBID) 100 MG capsule Take 1 capsule (100 mg total) by mouth 2 (two) times daily. (Patient not taking: Reported on 12/24/2022) 10 capsule 0   No facility-administered medications prior to visit.    ROS: Review of Systems  Objective:  BP 112/80   Pulse 83   Temp 98.3 F (36.8 C) (Oral)   Ht 5\' 5"  (1.651 m)   Wt 156 lb 12.8 oz (71.1 kg)   SpO2 97%   BMI 26.09 kg/m   BP Readings from Last 3 Encounters:  12/24/22 112/80  12/15/22 108/72  11/06/22 106/74    Wt Readings from Last 3 Encounters:  12/24/22 156 lb 12.8 oz (71.1 kg)  12/15/22 156 lb (70.8 kg)  11/06/22 157 lb (71.2 kg)    Physical Exam Constitutional:      General: She is not in acute distress.    Appearance: She is well-developed.  HENT:     Head: Normocephalic.     Right Ear: External ear normal.      Left Ear: External ear normal.     Nose: Nose normal.  Eyes:     General:        Right eye: No discharge.        Left eye: No discharge.     Conjunctiva/sclera: Conjunctivae normal.     Pupils: Pupils are equal, round, and reactive to light.  Neck:     Thyroid: No thyromegaly.     Vascular: No JVD.     Trachea: No tracheal deviation.  Cardiovascular:     Rate and Rhythm: Normal rate and regular rhythm.     Heart sounds: Normal heart sounds.  Pulmonary:     Effort: No respiratory distress.     Breath sounds: No stridor. No wheezing.  Abdominal:     General: Bowel sounds are normal. There is no distension.     Palpations: Abdomen is soft. There is no mass.     Tenderness: There is no abdominal tenderness. There is no guarding or rebound.  Musculoskeletal:        General: No tenderness.     Cervical back: Normal range of motion and neck supple. No rigidity.  Lymphadenopathy:     Cervical: No cervical adenopathy.  Skin:    Findings: No erythema or rash.  Neurological:     Cranial Nerves: No  cranial nerve deficit.     Motor: No abnormal muscle tone.     Coordination: Coordination normal.     Deep Tendon Reflexes: Reflexes normal.  Psychiatric:        Behavior: Behavior normal.        Thought Content: Thought content normal.        Judgment: Judgment normal.   Eryth throat TMs fluid behind B TMs  Lab Results  Component Value Date   WBC 4.2 10/29/2022   HGB 12.9 10/29/2022   HCT 39.8 10/29/2022   PLT 253.0 10/29/2022   GLUCOSE 95 10/29/2022   CHOL 221 (H) 10/29/2022   TRIG 69.0 10/29/2022   HDL 79.80 10/29/2022   LDLCALC 127 (H) 10/29/2022   ALT 12 10/29/2022   AST 18 10/29/2022   NA 143 10/29/2022   K 4.2 10/29/2022   CL 107 10/29/2022   CREATININE 0.76 10/29/2022   BUN 8 10/29/2022   CO2 27 10/29/2022   TSH 1.93 10/29/2022    MR BRAIN WO CONTRAST  Result Date: 05/26/2022 CLINICAL DATA:  Provided history: Intractable headache, unspecified chronicity  pattern, unspecified headache type. Additional history provided by the scanning technologist: The patient reports headaches for 30 years, symptoms worsening in the past one month, increased nausea. EXAM: MRI HEAD WITHOUT CONTRAST TECHNIQUE: Multiplanar, multiecho pulse sequences of the brain and surrounding structures were obtained without intravenous contrast. COMPARISON:  No pertinent prior exams available for comparison. FINDINGS: Brain: No age advanced or lobar predominant parenchymal atrophy. There are a several small nonspecific foci of T2 FLAIR hyperintense signal abnormality scattered within the bilateral cerebral white matter. No cortical encephalomalacia is identified. There is no acute infarct. No evidence of an intracranial mass. No chronic intracranial blood products. No extra-axial fluid collection. No midline shift. Vascular: Maintained flow voids within the proximal large arterial vessels. Skull and upper cervical spine: No focal suspicious marrow lesion. Sinuses/Orbits: No mass or acute finding within the imaged orbits. No significant paranasal sinus disease. IMPRESSION: 1.  No evidence of an acute intracranial abnormality. 2. Several small nonspecific foci of T2 FLAIR hyperintense signal abnormality within the bilateral cerebral white matter. These signal changes are nonspecific and differential considerations include chronic small vessel ischemic disease, sequelae of chronic migraine headaches, sequelae of a prior infectious/inflammatory process or sequelae of demyelinating disease, among others. No lesions specifically suggest demyelinating disease. Electronically Signed   By: Jackey Loge D.O.   On: 05/26/2022 19:06    Assessment & Plan:   Problem List Items Addressed This Visit     Upper respiratory infection - Primary    Strep test Z pack Claritin D 12 h bid      Relevant Medications   azithromycin (ZITHROMAX Z-PAK) 250 MG tablet      Meds ordered this encounter  Medications    loratadine-pseudoephedrine (CLARITIN-D 12 HOUR) 5-120 MG tablet    Sig: Take 1 tablet by mouth 2 (two) times daily as needed for allergies.    Dispense:  60 tablet    Refill:  1   azithromycin (ZITHROMAX Z-PAK) 250 MG tablet    Sig: As directed    Dispense:  6 tablet    Refill:  0      Follow-up: No follow-ups on file.  Sonda Primes, MD

## 2022-12-26 ENCOUNTER — Ambulatory Visit: Payer: 59

## 2022-12-26 DIAGNOSIS — M25562 Pain in left knee: Secondary | ICD-10-CM | POA: Diagnosis not present

## 2023-01-25 NOTE — Progress Notes (Signed)
[name] [address] [place] [address] Phone: [phone] Subjective:    I'm seeing this patient by the request  of:  Plotnikov, Dawn Quint, MD  CC: Back and neck pain follow-up  [id number]  Dawn [name] is a 52 y.o. female coming in with complaint of back and neck pain. OMT 12/15/2022. Also f/u for L knee pain. Patient states that she is more or less the same   Medications patient has been prescribed: None  Taking:     Left knee did have the MRI that there showed no significant meniscal injury.  Does have some moderate arthritic changes of the knee.  This was independently visualized by me today.    Reviewed prior external information including notes and imaging from previsou exam, outside providers and external EMR if available.   As well as notes that were available from care everywhere and other healthcare systems.  Past medical history, social, surgical and family history all reviewed in electronic medical record.  No pertanent information unless stated regarding to the chief complaint.   Past Medical History:  Diagnosis Date   Allergy    Frequent headaches    GERD (gastroesophageal reflux disease)    H/O sinus surgery     Allergies  Allergen Reactions   Amoxicillin    Zonegran [Zonisamide]     Bad side affect     Review of Systems:  No headache, visual changes, nausea, vomiting, diarrhea, constipation, dizziness, abdominal pain, skin rash, fevers, chills, night sweats, weight loss, swollen lymph nodes, body aches, joint swelling, chest pain, shortness of breath, mood changes. POSITIVE muscle aches  Objective  Blood pressure 118/80, pulse 76, height 5\' 5"  (1.651 m), weight 160 lb (72.6 kg), SpO2 98%.   General: No apparent distress alert and oriented x3 mood and affect normal, dressed appropriately.  HEENT: Pupils equal, extraocular movements intact  Respiratory: Patient's speak in full sentences and does not  appear short of breath  Cardiovascular: No lower extremity edema, non tender, no erythema  Tightness noted in the neck especially with sidebending bilaterally.  Patient has a negative Spurling's noted. Back exam does have tightness noted with loss lordosis noted.  Tightness with Pearlean Brownie right greater than left. Knee exam mild discomfort noted with palpation of the medial joint line but no significant instability   Osteopathic findings  C2 flexed rotated and side bent right C6 flexed rotated and side bent left T3 extended rotated and side bent right inhaled rib T9 extended rotated and side bent left L2 flexed rotated and side bent right L5 flexed rotated and side bent left Sacrum right on right       Assessment and Plan:  Pain and swelling of left knee Patient had the pain and swelling in the knee and did have a small effusion but it was secondary to more of arthritic changes.  Discussed icing regimen and home exercises, which activities to do and which ones to avoid.  Increase activity slowly otherwise.  No change in medications but has muscle relaxer tenacity in for breakthrough.  We have discussed the patient's knee could be a candidate for possible viscosupplementation and we will get approval to see if she would like to do it after the new year.  Continue to be active otherwise.  Lumbar radiculopathy Likely no radicular symptoms.  Has seen patient for multiple years.  Has responded extremely well to osteopathic manipulation.  Discussed posture and ergonomics.  Patient overall is in a good area  where she is able to be very active.  Encouraged her to continue to do so.  Follow-up again in 6 to 8 weeks    Nonallopathic problems  Decision today to treat with OMT was based on Physical Exam  After verbal consent patient was treated with HVLA, ME, FPR techniques in cervical, rib, thoracic, lumbar, and sacral  areas  Patient tolerated the procedure well with improvement in  symptoms  Patient given exercises, stretches and lifestyle modifications  See medications in patient instructions if given  Patient will follow up in 4-8 weeks     The above documentation has been reviewed and is accurate and complete [name], DO         Note: This dictation was prepared with Dragon dictation along with smaller phrase technology. Any transcriptional errors that result from this process are unintentional.

## 2023-01-29 ENCOUNTER — Encounter: Payer: Self-pay | Admitting: Family Medicine

## 2023-01-29 ENCOUNTER — Ambulatory Visit: Payer: 59 | Admitting: Family Medicine

## 2023-01-29 VITALS — BP 118/80 | HR 76 | Ht 65.0 in | Wt 160.0 lb

## 2023-01-29 DIAGNOSIS — M9901 Segmental and somatic dysfunction of cervical region: Secondary | ICD-10-CM

## 2023-01-29 DIAGNOSIS — M9908 Segmental and somatic dysfunction of rib cage: Secondary | ICD-10-CM

## 2023-01-29 DIAGNOSIS — M9902 Segmental and somatic dysfunction of thoracic region: Secondary | ICD-10-CM | POA: Diagnosis not present

## 2023-01-29 DIAGNOSIS — M9904 Segmental and somatic dysfunction of sacral region: Secondary | ICD-10-CM

## 2023-01-29 DIAGNOSIS — M25562 Pain in left knee: Secondary | ICD-10-CM

## 2023-01-29 DIAGNOSIS — M9903 Segmental and somatic dysfunction of lumbar region: Secondary | ICD-10-CM

## 2023-01-29 DIAGNOSIS — M5416 Radiculopathy, lumbar region: Secondary | ICD-10-CM | POA: Diagnosis not present

## 2023-01-29 DIAGNOSIS — M25462 Effusion, left knee: Secondary | ICD-10-CM

## 2023-01-29 NOTE — Assessment & Plan Note (Signed)
Likely no radicular symptoms.  Has seen patient for multiple years.  Has responded extremely well to osteopathic manipulation.  Discussed posture and ergonomics.  Patient overall is in a good area where she is able to be very active.  Encouraged her to continue to do so.  Follow-up again in 6 to 8 weeks

## 2023-01-29 NOTE — Assessment & Plan Note (Signed)
Patient had the pain and swelling in the knee and did have a small effusion but it was secondary to more of arthritic changes.  Discussed icing regimen and home exercises, which activities to do and which ones to avoid.  Increase activity slowly otherwise.  No change in medications but has muscle relaxer tenacity in for breakthrough.  We have discussed the patient's knee could be a candidate for possible viscosupplementation and we will get approval to see if she would like to do it after the new year.  Continue to be active otherwise.

## 2023-01-29 NOTE — Patient Instructions (Signed)
Good to sew you as always  Glad the kiddos are doing good 6 week follow up

## 2023-03-12 NOTE — Progress Notes (Signed)
 [name] [address] [place] [address] Phone: [phone] Subjective:   Dawn [name], am serving as a scribe for Dr. Ronnell Coins.  I'm seeing this patient by the request  of:  Plotnikov, [name], MD  CC: left knee, back and neck pain follow up   [id number]  Dawn [name] is a 53 y.o. female coming in with complaint of back and neck pain. OMT 01/29/2023. Also f/u for L knee pain. Here for Durolane today. Patient states knee not as bad, but hasn't been playing tennis. No gel today.  Medications patient has been prescribed:   Taking:         Reviewed prior external information including notes and imaging from previsou exam, outside providers and external EMR if available.   As well as notes that were available from care everywhere and other healthcare systems.  Past medical history, social, surgical and family history all reviewed in electronic medical record.  No pertanent information unless stated regarding to the chief complaint.   Past Medical History:  Diagnosis Date   Allergy    Frequent headaches    GERD (gastroesophageal reflux disease)    H/O sinus surgery     Allergies  Allergen Reactions   Amoxicillin    Zonegran [Zonisamide]     Bad side affect     Review of Systems:  No headache, visual changes, nausea, vomiting, diarrhea, constipation, dizziness, abdominal pain, skin rash, fevers, chills, night sweats, weight loss, swollen lymph nodes, body aches, joint swelling, chest pain, shortness of breath, mood changes. POSITIVE muscle aches  Objective  Blood pressure 114/72, pulse 98, height 5\' 5"  (1.651 m), weight 160 lb (72.6 kg), SpO2 97%.   General: No apparent distress alert and oriented x3 mood and affect normal, dressed appropriately.  HEENT: Pupils equal, extraocular movements intact  Respiratory: Patient's speak in full sentences and does not appear short of breath  Cardiovascular: No lower  extremity edema, non tender, no erythema  Gait mild antalgic favoring the left knee. Left knee does have some tenderness to palpation but very minimal.  No significant instability mild crepitus MSK:  Back does have some loss lordosis noted.  Some tenderness to palpation in the paraspinal musculature. Left knee does have some patellofemoral grinding and lateral tracking noted.  Osteopathic findings  C2 flexed rotated and side bent right C6 flexed rotated and side bent left T3 extended rotated and side bent right inhaled rib T9 extended rotated and side bent left L2 flexed rotated and side bent right Sacrum right on right   After informed written and verbal consent, patient was seated on exam table. Left knee was prepped with alcohol swab and utilizing anterolateral approach, patient's left knee space was injected with 60 mg per 3 mL of Durolane (sodium hyaluronate) in a prefilled syringe was injected easily into the knee through a 22-gauge needle..Patient tolerated the procedure well without immediate complications.    Assessment and Plan:  Low back pain Chronic, with mild exacerbation.  Discussed icing regimen exercises, discussed which activities to do and which ones to avoid.  Increase activity slowly.  Discussed which activities to do and which ones to avoid.  Increase activity slowly.  Bilateral patellofemoral syndrome Given injection today and tolerated the procedure well, discussed icing regimen and home exercises, which activities to do and which ones to avoid.  Discussed continuing to stay active.  Patient should do well with this and hopeful that we will get somewhere between 6  and 18 months of relief.    Nonallopathic problems  Decision today to treat with OMT was based on Physical Exam  After verbal consent patient was treated with HVLA, ME, FPR techniques in cervical, rib, thoracic, lumbar, and sacral  areas  Patient tolerated the procedure well with improvement in  symptoms  Patient given exercises, stretches and lifestyle modifications  See medications in patient instructions if given  Patient will follow up in 4-8 weeks    The above documentation has been reviewed and is accurate and complete Mattew Chriswell M Kensley Lares, DO          Note: This dictation was prepared with Dragon dictation along with smaller phrase technology. Any transcriptional errors that result from this process are unintentional.

## 2023-03-15 ENCOUNTER — Ambulatory Visit: Payer: 59 | Admitting: Family Medicine

## 2023-03-15 ENCOUNTER — Encounter: Payer: Self-pay | Admitting: Family Medicine

## 2023-03-15 VITALS — BP 114/72 | HR 98 | Ht 65.0 in | Wt 160.0 lb

## 2023-03-15 DIAGNOSIS — M545 Low back pain, unspecified: Secondary | ICD-10-CM

## 2023-03-15 DIAGNOSIS — M222X2 Patellofemoral disorders, left knee: Secondary | ICD-10-CM | POA: Diagnosis not present

## 2023-03-15 DIAGNOSIS — M25562 Pain in left knee: Secondary | ICD-10-CM

## 2023-03-15 DIAGNOSIS — M9901 Segmental and somatic dysfunction of cervical region: Secondary | ICD-10-CM

## 2023-03-15 DIAGNOSIS — G8929 Other chronic pain: Secondary | ICD-10-CM

## 2023-03-15 DIAGNOSIS — M9904 Segmental and somatic dysfunction of sacral region: Secondary | ICD-10-CM

## 2023-03-15 DIAGNOSIS — M222X1 Patellofemoral disorders, right knee: Secondary | ICD-10-CM

## 2023-03-15 DIAGNOSIS — M9908 Segmental and somatic dysfunction of rib cage: Secondary | ICD-10-CM

## 2023-03-15 DIAGNOSIS — M9902 Segmental and somatic dysfunction of thoracic region: Secondary | ICD-10-CM | POA: Diagnosis not present

## 2023-03-15 DIAGNOSIS — M9903 Segmental and somatic dysfunction of lumbar region: Secondary | ICD-10-CM

## 2023-03-15 DIAGNOSIS — M25462 Effusion, left knee: Secondary | ICD-10-CM

## 2023-03-15 MED ORDER — SODIUM HYALURONATE 60 MG/3ML IX PRSY
60.0000 mg | PREFILLED_SYRINGE | Freq: Once | INTRA_ARTICULAR | Status: AC
Start: 2023-03-15 — End: 2023-03-15
  Administered 2023-03-15: 60 mg via INTRA_ARTICULAR

## 2023-03-15 NOTE — Assessment & Plan Note (Signed)
 Given injection today and tolerated the procedure well, discussed icing regimen and home exercises, which activities to do and which ones to avoid.  Discussed continuing to stay active.  Patient should do well with this and hopeful that we will get somewhere between 6 and 18 months of relief.

## 2023-03-15 NOTE — Assessment & Plan Note (Signed)
 Chronic, with mild exacerbation.  Discussed icing regimen exercises, discussed which activities to do and which ones to avoid.  Increase activity slowly.  Discussed which activities to do and which ones to avoid.  Increase activity slowly.

## 2023-03-15 NOTE — Patient Instructions (Signed)
 Gel injection today Good to see you! See you again in 6 weeks

## 2023-03-15 NOTE — Addendum Note (Signed)
 Addended by: Bryan Caprio R on: 03/15/2023 11:23 AM   Modules accepted: Orders

## 2023-04-07 IMAGING — DX DG SHOULDER 2+V*R*
3 series · 3 of 3 positions shown · non-contrast
Comparison: None.

CLINICAL DATA: Right humeral pain radiating to right shoulder for
the past 4-5 months.

EXAM:
RIGHT SHOULDER - 2+ VIEW

[shoulder ap (1 of 2)]
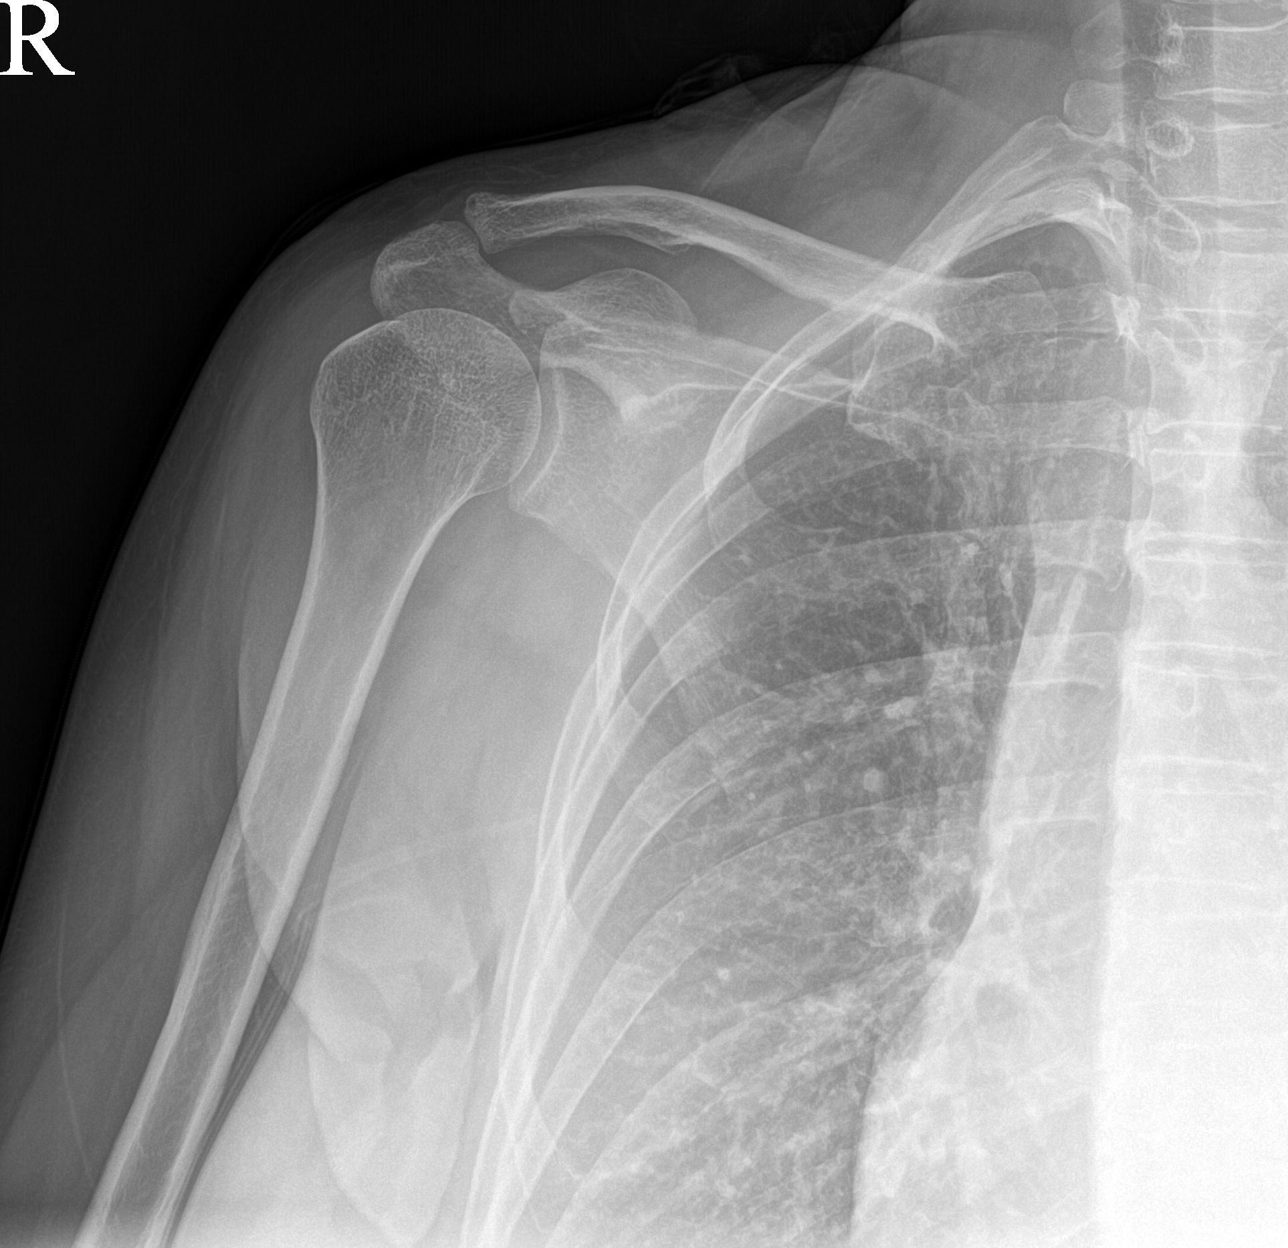

[shoulder ap (2 of 2)]
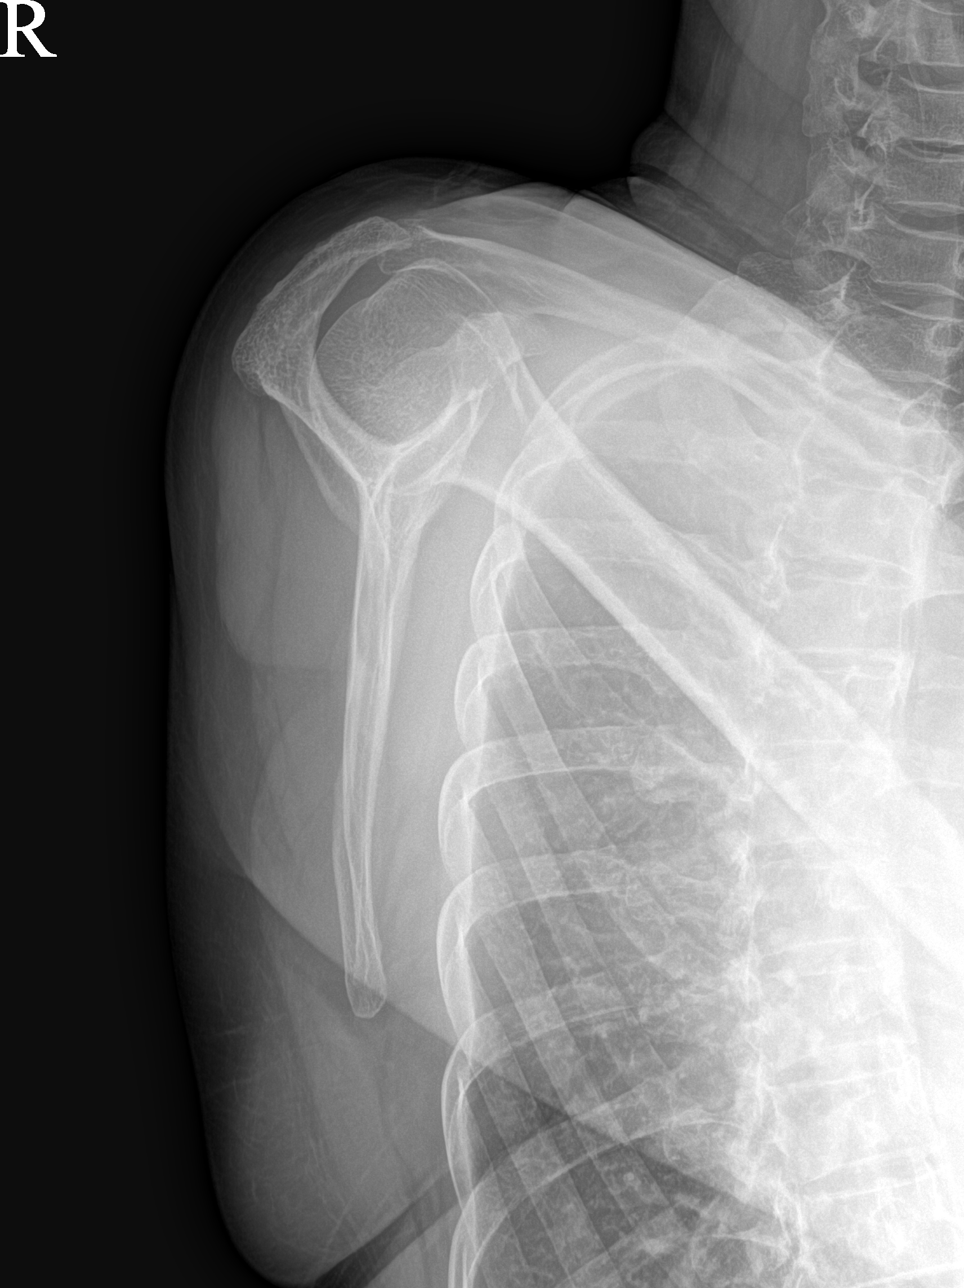

[shoulder axial]
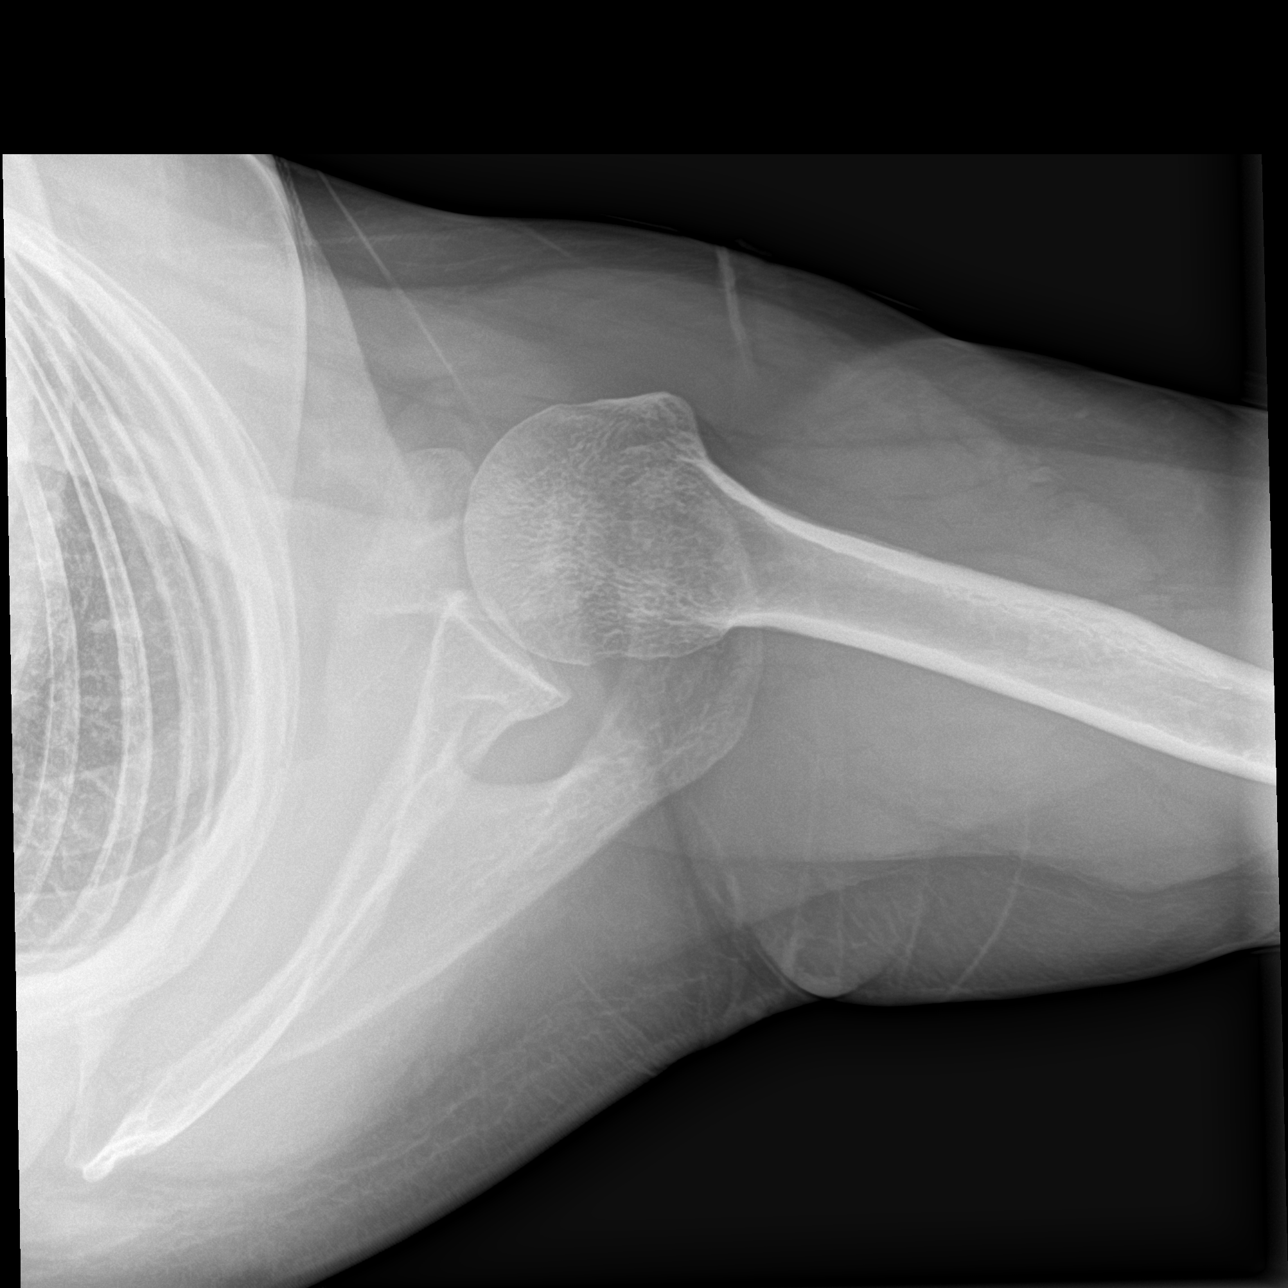

[3 of 3 positions shown; findings below may reference images not displayed]

FINDINGS: No fracture or dislocation. Glenohumeral and acromioclavicular joint
spaces are preserved. No evidence of calcific tendinitis. Limited
visualization of the adjacent thorax is normal. Regional soft
tissues appear normal.
IMPRESSION: Explanation for patient's right shoulder and humerus pain.

## 2023-04-26 NOTE — Progress Notes (Unsigned)
 [name] [address] [place] [address] Phone: [phone] Subjective:   [name], am serving as a scribe for Dr. Antoine Primas.  I'm seeing this patient by the request  of:  Plotnikov, Georgina Quint, MD  CC: Back and neck pain follow-up  [id number]  Dawn [name] is a 53 y.o. female coming in with complaint of back and neck pain. OMT 03/15/2023. Patient states that she did have an issue with her back when she was driving for about 6 hours. No issues since then.   Medications patient has been prescribed: None  Taking:.         Reviewed prior external information including notes and imaging from previsou exam, outside providers and external EMR if available.   As well as notes that were available from care everywhere and other healthcare systems.  Past medical history, social, surgical and family history all reviewed in electronic medical record.  No pertanent information unless stated regarding to the chief complaint.   Past Medical History:  Diagnosis Date   Allergy    Frequent headaches    GERD (gastroesophageal reflux disease)    H/O sinus surgery     Allergies  Allergen Reactions   Amoxicillin    Zonegran [Zonisamide]     Bad side affect     Review of Systems:  No headache, visual changes, nausea, vomiting, diarrhea, constipation, dizziness, abdominal pain, skin rash, fevers, chills, night sweats, weight loss, swollen lymph nodes, body aches, joint swelling, chest pain, shortness of breath, mood changes. POSITIVE muscle aches  Objective  Blood pressure 102/66, pulse 79, height 5\' 5"  (1.651 m), weight 162 lb (73.5 kg), SpO2 97%.   General: No apparent distress alert and oriented x3 mood and affect normal, dressed appropriately.  HEENT: Pupils equal, extraocular movements intact  Respiratory: Patient's speak in full sentences and does not appear short of breath  Cardiovascular: No lower extremity edema, non  tender, no erythema  MSK:  Back does have some loss lordosis noted.  Some tightness noted.  Seems to be more in the paraspinal musculature of the lumbar spine  Osteopathic findings  C3 flexed rotated and side bent right C6 flexed rotated and side bent left T3 extended rotated and side bent right inhaled rib T9 extended rotated and side bent left L1 flexed rotated and side bent right Sacrum right on right       Assessment and Plan:  Low back pain More of a tightness in the low back.  Has not been quite as active.  Has had some difficulty over the winter.  Discussed with patient continuing home exercises.  We discussed which activities to do things to avoid.  Increase activity over the course of next several weeks.  Discussed icing regimen.  Follow-up again in 6 to 8 weeks otherwise.    Nonallopathic problems  Decision today to treat with OMT was based on Physical Exam  After verbal consent patient was treated with HVLA, ME, FPR techniques in cervical, rib, thoracic, lumbar, and sacral  areas  Patient tolerated the procedure well with improvement in symptoms  Patient given exercises, stretches and lifestyle modifications  See medications in patient instructions if given  Patient will follow up in 4-8 weeks    The above documentation has been reviewed and is accurate and complete [name], DO          Note: This dictation was prepared with Dragon dictation along with smaller phrase technology. Any transcriptional  errors that result from this process are unintentional.

## 2023-04-27 ENCOUNTER — Ambulatory Visit: Payer: 59 | Admitting: Family Medicine

## 2023-04-27 ENCOUNTER — Encounter: Payer: Self-pay | Admitting: Family Medicine

## 2023-04-27 VITALS — BP 102/66 | HR 79 | Ht 65.0 in | Wt 162.0 lb

## 2023-04-27 DIAGNOSIS — M545 Low back pain, unspecified: Secondary | ICD-10-CM

## 2023-04-27 DIAGNOSIS — M9908 Segmental and somatic dysfunction of rib cage: Secondary | ICD-10-CM | POA: Diagnosis not present

## 2023-04-27 DIAGNOSIS — M9904 Segmental and somatic dysfunction of sacral region: Secondary | ICD-10-CM

## 2023-04-27 DIAGNOSIS — M9903 Segmental and somatic dysfunction of lumbar region: Secondary | ICD-10-CM | POA: Diagnosis not present

## 2023-04-27 DIAGNOSIS — M9902 Segmental and somatic dysfunction of thoracic region: Secondary | ICD-10-CM | POA: Diagnosis not present

## 2023-04-27 DIAGNOSIS — G8929 Other chronic pain: Secondary | ICD-10-CM

## 2023-04-27 DIAGNOSIS — M9901 Segmental and somatic dysfunction of cervical region: Secondary | ICD-10-CM | POA: Diagnosis not present

## 2023-04-27 NOTE — Assessment & Plan Note (Signed)
 More of a tightness in the low back.  Has not been quite as active.  Has had some difficulty over the winter.  Discussed with patient continuing home exercises.  We discussed which activities to do things to avoid.  Increase activity over the course of next several weeks.  Discussed icing regimen.  Follow-up again in 6 to 8 weeks otherwise.

## 2023-04-27 NOTE — Patient Instructions (Signed)
 Good to see you Good luck with soccer season See me in 6-8 weeks

## 2023-06-17 NOTE — Progress Notes (Unsigned)
 [name] [address] [place] [address] Phone: [phone] Subjective:   Dawn [name], am serving as a scribe for Dr. Ronnell Coins.  I'm seeing this patient by the request  of:  Plotnikov, [name], MD  CC: Low back pain, left and right elbow pain  VOZ:[id number]  Dawn [name] is a 53 y.o. female coming in with complaint of back and neck pain. OMT 04/27/2023. Patient states that she is having L hip pain in the glute medius that can radiate into the groin. Notes loss of ROM over the past couple of years.   Also having pain in the R elbow over lateral epi. Pain with use. Has not been playing tennis for past 2 month but is getting back into playing. Returning more power shots increases her pain. Denies any numbness or tingling.   Medications patient has been prescribed: None  Taking:         Reviewed prior external information including notes and imaging from previsou exam, outside providers and external EMR if available.   As well as notes that were available from care everywhere and other healthcare systems.  Past medical history, social, surgical and family history all reviewed in electronic medical record.  No pertanent information unless stated regarding to the chief complaint.   Past Medical History:  Diagnosis Date   Allergy    Frequent headaches    GERD (gastroesophageal reflux disease)    H/O sinus surgery     Allergies  Allergen Reactions   Amoxicillin    Zonegran [Zonisamide]     Bad side affect     Review of Systems:  No headache, visual changes, nausea, vomiting, diarrhea, constipation, dizziness, abdominal pain, skin rash, fevers, chills, night sweats, weight loss, swollen lymph nodes, body aches, joint swelling, chest pain, shortness of breath, mood changes. POSITIVE muscle aches  Objective  Blood pressure 108/72, pulse 76, height 5\' 5"  (1.651 m), weight 164 lb (74.4 kg), last menstrual period  06/16/2022, SpO2 97%.   General: No apparent distress alert and oriented x3 mood and affect normal, dressed appropriately.  HEENT: Pupils equal, extraocular movements intact  Respiratory: Patient's speak in full sentences and does not appear short of breath  Cardiovascular: No lower extremity edema, non tender, no erythema  Gait relatively normal MSK:  Back does have some loss lordosis.  Tightness noted in the paraspinal musculature.  Tightness noted on the right side of the back more than the left side of the back.  Negative straight leg test noted at the moment.  Right elbow tender to palpation noted.  Worsening pain with resisted extension of the wrist.  Limited muscular skeletal ultrasound was performed and interpreted by Ronnell Coins, M  Limited ultrasound of patient's right wrist shows hypoechoic changes with increasing Doppler flow at the common extensor tendon. Impression: Lateral epicondylitis  Osteopathic findings  C2 flexed rotated and side bent right C6 flexed rotated and side bent left T3 extended rotated and side bent right inhaled rib L2 flexed rotated and side bent right L3 flexed rotated and side bent left Sacrum right on right       Assessment and Plan:  Right lateral epicondylitis Will call, with exacerbation.  Has been quite some time.  Discussed icing regimen, home exercises, bracing of wrist at night, avoid overhead lifting.  Gets which activities to do and which ones to avoid.  Increase activity slowly.  Follow-up again in 6 to 8 weeks  Lumbar radiculopathy Low back  exam does have some loss lordosis noted.  Tightness noted still that we will need to continue to monitor.  Does respond well to osteopathic manipulation.  Discussed icing regimen and home exercises.  Follow-up again in 6 to 8 weeks    Nonallopathic problems  Decision today to treat with OMT was based on Physical Exam  After verbal consent patient was treated with HVLA, ME, FPR techniques  in cervical, rib, thoracic, lumbar, and sacral  areas  Patient tolerated the procedure well with improvement in symptoms  Patient given exercises, stretches and lifestyle modifications  See medications in patient instructions if given  Patient will follow up in 4-8 weeks     The above documentation has been reviewed and is accurate and complete Izell Labat M Dannetta Lekas, DO         Note: This dictation was prepared with Dragon dictation along with smaller phrase technology. Any transcriptional errors that result from this process are unintentional.

## 2023-06-18 ENCOUNTER — Encounter: Payer: Self-pay | Admitting: Family Medicine

## 2023-06-18 ENCOUNTER — Ambulatory Visit (INDEPENDENT_AMBULATORY_CARE_PROVIDER_SITE_OTHER)

## 2023-06-18 ENCOUNTER — Ambulatory Visit: Admitting: Family Medicine

## 2023-06-18 ENCOUNTER — Other Ambulatory Visit: Payer: Self-pay

## 2023-06-18 VITALS — BP 108/72 | HR 76 | Ht 65.0 in | Wt 164.0 lb

## 2023-06-18 DIAGNOSIS — M5416 Radiculopathy, lumbar region: Secondary | ICD-10-CM

## 2023-06-18 DIAGNOSIS — M9901 Segmental and somatic dysfunction of cervical region: Secondary | ICD-10-CM | POA: Diagnosis not present

## 2023-06-18 DIAGNOSIS — M25552 Pain in left hip: Secondary | ICD-10-CM | POA: Diagnosis not present

## 2023-06-18 DIAGNOSIS — M9904 Segmental and somatic dysfunction of sacral region: Secondary | ICD-10-CM | POA: Diagnosis not present

## 2023-06-18 DIAGNOSIS — M7711 Lateral epicondylitis, right elbow: Secondary | ICD-10-CM | POA: Diagnosis not present

## 2023-06-18 DIAGNOSIS — M9908 Segmental and somatic dysfunction of rib cage: Secondary | ICD-10-CM

## 2023-06-18 DIAGNOSIS — M25521 Pain in right elbow: Secondary | ICD-10-CM | POA: Diagnosis not present

## 2023-06-18 DIAGNOSIS — M9902 Segmental and somatic dysfunction of thoracic region: Secondary | ICD-10-CM

## 2023-06-18 DIAGNOSIS — M9903 Segmental and somatic dysfunction of lumbar region: Secondary | ICD-10-CM

## 2023-06-18 NOTE — Patient Instructions (Addendum)
 Xray today Have a great trip [place] and lateral epi exercises Wrist brace at night Avoid overhand lifting New grip on racket Check tennis shoes See me in August

## 2023-06-18 NOTE — Assessment & Plan Note (Signed)
 Low back exam does have some loss lordosis noted.  Tightness noted still that we will need to continue to monitor.  Does respond well to osteopathic manipulation.  Discussed icing regimen and home exercises.  Follow-up again in 6 to 8 weeks

## 2023-06-18 NOTE — Assessment & Plan Note (Signed)
 Will call, with exacerbation.  Has been quite some time.  Discussed icing regimen, home exercises, bracing of wrist at night, avoid overhead lifting.  Gets which activities to do and which ones to avoid.  Increase activity slowly.  Follow-up again in 6 to 8 weeks

## 2023-06-20 ENCOUNTER — Ambulatory Visit: Payer: Self-pay | Admitting: Family Medicine

## 2023-09-02 NOTE — Progress Notes (Unsigned)
 Dawn [name] JENI Dawn [name] [address] [place] [address] Phone: [phone] Subjective:   Dawn [name], am serving as a scribe for Dr. Arthea [name].  I'm seeing this patient by the request  of:  Dawn [name], Dawn [name], MD  CC: Back and neck pain follow-up  YEP:Dawn [name]  Dawn [name] is a 53 y.o. female coming in with complaint of back and neck pain. OMT 06/28/2023. R elbow and L hip f/u as well. Patient states hip is doing okay. Elbow still hurts. Has not been playing tennis.  Medications patient has been prescribed: None  Taking:         Reviewed prior external information including notes and imaging from previsou exam, outside providers and external EMR if available.   As well as notes that were available from care everywhere and other healthcare systems.  Past medical history, social, surgical and family history all reviewed in electronic medical record.  No pertanent information unless stated regarding to the chief complaint.   Past Medical History:  Diagnosis Date   Allergy    Frequent headaches    GERD (gastroesophageal reflux disease)    H/O sinus surgery     Allergies  Allergen Reactions   Amoxicillin    Zonegran [Zonisamide]     Bad side affect     Review of Systems:  No headache, visual changes, nausea, vomiting, diarrhea, constipation, dizziness, abdominal pain, skin rash, fevers, chills, night sweats, weight loss, swollen lymph nodes, body aches, joint swelling, chest pain, shortness of breath, mood changes. POSITIVE muscle aches  Objective  Blood pressure 112/70, pulse 75, height 5' 5 (1.651 m), weight 163 lb (73.9 kg), last menstrual period 06/16/2022, SpO2 97%.   General: No apparent distress alert and oriented x3 mood and affect normal, dressed appropriately.  HEENT: Pupils equal, extraocular movements intact  Respiratory: Patient's speak in full sentences and does not appear short of breath  Cardiovascular: No  lower extremity edema, non tender, no erythema  Gait MSK:  Back does have some loss of lordosis noted.  Some tenderness to palpation in the paraspinal musculature.  Patient does have some limited sidebending of the neck bilaterally.  Right elbow exam shows severe tenderness to palpation over the lateral epicondylar area.  Worsening pain with the resisted extension of the middle finger.  This seems to hurt more at the elbow itself.  Osteopathic findings  C2 flexed rotated and side bent right C6 flexed rotated and side bent left T3 extended rotated and side bent right inhaled rib T9 extended rotated and side bent left L2 flexed rotated and side bent right Sacrum right on right   Limited muscular skeletal ultrasound was performed and interpreted by Dawn [name], M  Limited ultrasound shows that there is a tear noted of the common extensor tendon.  Fairly significant in size with at least high-grade if not full-thickness.  Minimal retraction noted.  No significant cortical irregularity noted at this moment.    Assessment and Plan:  Partial tear of common extensor tendon of right elbow Discussed with patient at great length.  Had lateral epicondylitis but worsening symptoms.  Ultrasound shows that there is a tear noted at this point.  Will consider the possibility of injection.  Will consider PRP at a later date.  Low back pain Some increasing tightness secondary to patient traveling recently.  Discussed posture and ergonomics, discussed home exercises.  Follow-up again in 6 to 8 weeks.    Nonallopathic problems  Decision today to treat  with OMT was based on Physical Exam  After verbal consent patient was treated with HVLA, ME, FPR techniques in cervical, rib, thoracic, lumbar, and sacral  areas  Patient tolerated the procedure well with improvement in symptoms  Patient given exercises, stretches and lifestyle modifications  See medications in patient instructions if  given  Patient will follow up in 4-8 weeks    The above documentation has been reviewed and is accurate and complete Dawn CHRISTELLA Sharps, DO          Note: This dictation was prepared with Dragon dictation along with smaller phrase technology. Any transcriptional errors that result from this process are unintentional.

## 2023-09-07 ENCOUNTER — Encounter: Payer: Self-pay | Admitting: Family Medicine

## 2023-09-07 ENCOUNTER — Ambulatory Visit: Admitting: Family Medicine

## 2023-09-07 ENCOUNTER — Other Ambulatory Visit: Payer: Self-pay

## 2023-09-07 VITALS — BP 112/70 | HR 75 | Ht 65.0 in | Wt 163.0 lb

## 2023-09-07 DIAGNOSIS — M9903 Segmental and somatic dysfunction of lumbar region: Secondary | ICD-10-CM

## 2023-09-07 DIAGNOSIS — M9904 Segmental and somatic dysfunction of sacral region: Secondary | ICD-10-CM

## 2023-09-07 DIAGNOSIS — M545 Low back pain, unspecified: Secondary | ICD-10-CM

## 2023-09-07 DIAGNOSIS — S56511A Strain of other extensor muscle, fascia and tendon at forearm level, right arm, initial encounter: Secondary | ICD-10-CM | POA: Insufficient documentation

## 2023-09-07 DIAGNOSIS — G8929 Other chronic pain: Secondary | ICD-10-CM

## 2023-09-07 DIAGNOSIS — M9901 Segmental and somatic dysfunction of cervical region: Secondary | ICD-10-CM

## 2023-09-07 DIAGNOSIS — M9902 Segmental and somatic dysfunction of thoracic region: Secondary | ICD-10-CM

## 2023-09-07 DIAGNOSIS — M25521 Pain in right elbow: Secondary | ICD-10-CM | POA: Diagnosis not present

## 2023-09-07 DIAGNOSIS — M9908 Segmental and somatic dysfunction of rib cage: Secondary | ICD-10-CM

## 2023-09-07 NOTE — Patient Instructions (Addendum)
 PRP Friday at 8am See you again in 2 months for MSK

## 2023-09-07 NOTE — Assessment & Plan Note (Signed)
 Discussed with patient at great length.  Had lateral epicondylitis but worsening symptoms.  Ultrasound shows that there is a tear noted at this point.  Will consider the possibility of injection.  Will consider PRP at a later date.

## 2023-09-07 NOTE — Assessment & Plan Note (Signed)
 Some increasing tightness secondary to patient traveling recently.  Discussed posture and ergonomics, discussed home exercises.  Follow-up again in 6 to 8 weeks.

## 2023-09-08 NOTE — Progress Notes (Unsigned)
  [name] [address] [place] [address] Phone: [phone] Subjective:   Dawn [name], am serving as a scribe for Dr. Arthea Claudene.  I'm seeing this patient by the request  of:  Plotnikov, Karlynn [name], MD  CC: Right elbow here for PRP  YEP:Dlagzrupcz  Dawn [name] is a 53 y.o. female coming in with complaint of R elbow pain.  Lateral epicondylitis with common extensor tendon tear  Objective  Last menstrual period 06/16/2022.   General: No apparent distress alert and oriented x3 mood and affect normal, dressed appropriately.   Procedure: Real-time Ultrasound Guided Injection of right common extensor tendon sheath Device: GE Logiq Q7 Ultrasound guided injection is preferred based studies that show increased duration, increased effect, greater accuracy, decreased procedural pain, increased response rate, and decreased cost with ultrasound guided versus blind injection.  Verbal informed consent obtained.  Time-out conducted.  Noted no overlying erythema, induration, or other signs of local infection.  Skin prepped in a sterile fashion.  Local anesthesia: Topical Ethyl chloride.  With sterile technique and under real time ultrasound guidance: With a 25-gauge half inch needle injected with 0.5 cc of 0.5% Marcaine and 3 cc of PRP Completed without difficulty  Pain immediately resolved suggesting accurate placement of the medication.  Advised to call if fevers/chills, erythema, induration, drainage, or persistent bleeding.  Images saved Impression: Technically successful ultrasound guided injection.    Impression and Recommendations:    The above documentation has been reviewed and is accurate and complete Princetta Uplinger M Lizmarie Witters, DO

## 2023-09-10 ENCOUNTER — Ambulatory Visit (INDEPENDENT_AMBULATORY_CARE_PROVIDER_SITE_OTHER): Payer: Self-pay | Admitting: Family Medicine

## 2023-09-10 ENCOUNTER — Other Ambulatory Visit: Payer: Self-pay

## 2023-09-10 ENCOUNTER — Encounter: Payer: Self-pay | Admitting: Family Medicine

## 2023-09-10 VITALS — BP 106/72 | HR 76 | Ht 65.0 in | Wt 164.0 lb

## 2023-09-10 DIAGNOSIS — M25521 Pain in right elbow: Secondary | ICD-10-CM

## 2023-09-10 DIAGNOSIS — M7711 Lateral epicondylitis, right elbow: Secondary | ICD-10-CM

## 2023-09-10 NOTE — Assessment & Plan Note (Signed)
 PRP done, post PRP instructions given

## 2023-09-10 NOTE — Patient Instructions (Signed)
 No ice or IBU for 3 days Heat and Tylenol are ok See me again in 6 weeks

## 2023-09-30 ENCOUNTER — Other Ambulatory Visit: Payer: Self-pay | Admitting: Obstetrics and Gynecology

## 2023-09-30 DIAGNOSIS — Z1231 Encounter for screening mammogram for malignant neoplasm of breast: Secondary | ICD-10-CM

## 2023-10-22 ENCOUNTER — Ambulatory Visit: Admitting: Family Medicine

## 2023-11-02 NOTE — Progress Notes (Deleted)
 [name] [address] [place] [address] Phone: [phone] Subjective:    I'm seeing this patient by the request  of:  Plotnikov, Karlynn [name], MD  CC: right elbow pain   SUBJECTIVE: Dawn [name] is a 53 y.o. female coming in with complaint of R elbow pain. PRP last visit. Patient states       Past Medical History:  Diagnosis Date   Allergy    Frequent headaches    GERD (gastroesophageal reflux disease)    H/O sinus surgery    No past surgical history on file. Social History   Socioeconomic History   Marital status: Married    Spouse name: Not on file   Number of children: 3   Years of education: Not on file   Highest education level: Master's degree (e.g., MA, MS, MEng, MEd, MSW, MBA)  Occupational History   Not on file  Tobacco Use   Smoking status: Never   Smokeless tobacco: Never  Substance and Sexual Activity   Alcohol use: Yes   Drug use: No   Sexual activity: Yes    Birth control/protection: I.U.D.  Other Topics Concern   Not on file  Social History Narrative   Not on file   Social Drivers of Health   Financial Resource Strain: Low Risk  (10/28/2022)   Overall Financial Resource Strain (CARDIA)    Difficulty of Paying Living Expenses: Not hard at all  Food Insecurity: No Food Insecurity (10/28/2022)   Hunger Vital Sign    Worried About Running Out of Food in the Last Year: Never true    Ran Out of Food in the Last Year: Never true  Transportation Needs: No Transportation Needs (10/28/2022)   PRAPARE - Administrator, Civil Service (Medical): No    Lack of Transportation (Non-Medical): No  Physical Activity: Sufficiently Active (10/28/2022)   Exercise Vital Sign    Days of Exercise per Week: 3 days    Minutes of Exercise per Session: 90 min  Stress: Stress Concern Present (10/28/2022)   [employer] of Occupational Health - Occupational Stress Questionnaire    Feeling of Stress : To some  extent  Social Connections: Moderately Integrated (10/28/2022)   Social Connection and Isolation Panel    Frequency of Communication with Friends and Family: More than three times a week    Frequency of Social Gatherings with Friends and Family: More than three times a week    Attends Religious Services: Never    Database Administrator or Organizations: Yes    Attends Engineer, Structural: More than 4 times per year    Marital Status: Married   Allergies  Allergen Reactions   Amoxicillin    Zonegran [Zonisamide]     Bad side affect   Family History  Problem Relation Age of Onset   Asthma Mother    Hyperlipidemia Mother    Cancer Maternal Grandmother    Hypertension Maternal Grandmother    Kidney disease Maternal Grandmother    Cancer Maternal Grandfather    Early death Paternal Grandmother    Diabetes Paternal Grandfather    Arthritis Paternal Grandfather    Hypertension Paternal Grandfather       Current Outpatient Medications (Respiratory):    loratadine  (CLARITIN ) 10 MG tablet, Take 1 tablet (10 mg total) by mouth daily.   loratadine -pseudoephedrine (CLARITIN -D 12 HOUR) 5-120 MG tablet, Take 1 tablet by mouth 2 (two) times daily as needed for allergies.  Current  Outpatient Medications (Analgesics):    SUMAtriptan (IMITREX) 100 MG tablet, Take 100 mg by mouth every 2 (two) hours as needed for migraine.   Current Outpatient Medications (Other):    azithromycin  (ZITHROMAX  Z-PAK) 250 MG tablet, As directed   Cholecalciferol (VITAMIN D3) 2000 units capsule, Take 1 capsule (2,000 Units total) by mouth daily.   triamcinolone  (KENALOG ) 0.1 % paste, Use as directed 1 Application in the mouth or throat 2 (two) times daily.   zolpidem  (AMBIEN ) 10 MG tablet, Take 0.5-1 tablets (5-10 mg total) by mouth at bedtime as needed for sleep.   Reviewed prior external information including notes and imaging from  primary care provider As well as notes that were available from  care everywhere and other healthcare systems.  Past medical history, social, surgical and family history all reviewed in electronic medical record.  No pertanent information unless stated regarding to the chief complaint.   Review of Systems:  No headache, visual changes, nausea, vomiting, diarrhea, constipation, dizziness, abdominal pain, skin rash, fevers, chills, night sweats, weight loss, swollen lymph nodes, body aches, joint swelling, chest pain, shortness of breath, mood changes. POSITIVE muscle aches  Objective  Last menstrual period 06/16/2022.   General: No apparent distress alert and oriented x3 mood and affect normal, dressed appropriately.  HEENT: Pupils equal, extraocular movements intact  Respiratory: Patient's speak in full sentences and does not appear short of breath  Cardiovascular: No lower extremity edema, non tender, no erythema  Elbow exam shows   Limited muscular skeletal ultrasound was performed and interpreted by Dawn [name], M      Impression and Recommendations:     The above documentation has been reviewed and is accurate and complete Dawn Storlie M Donnis Pecha, DO

## 2023-11-03 ENCOUNTER — Encounter: Payer: Self-pay | Admitting: Internal Medicine

## 2023-11-03 ENCOUNTER — Ambulatory Visit (INDEPENDENT_AMBULATORY_CARE_PROVIDER_SITE_OTHER): Admitting: Internal Medicine

## 2023-11-03 ENCOUNTER — Encounter: Payer: Self-pay | Admitting: Family Medicine

## 2023-11-03 ENCOUNTER — Ambulatory Visit: Admitting: Family Medicine

## 2023-11-03 ENCOUNTER — Other Ambulatory Visit: Payer: Self-pay

## 2023-11-03 VITALS — BP 116/82 | HR 70 | Temp 98.1°F | Ht 65.0 in | Wt 163.0 lb

## 2023-11-03 VITALS — BP 112/72 | HR 78 | Ht 65.0 in | Wt 164.0 lb

## 2023-11-03 DIAGNOSIS — Z23 Encounter for immunization: Secondary | ICD-10-CM

## 2023-11-03 DIAGNOSIS — E039 Hypothyroidism, unspecified: Secondary | ICD-10-CM | POA: Diagnosis not present

## 2023-11-03 DIAGNOSIS — M25521 Pain in right elbow: Secondary | ICD-10-CM

## 2023-11-03 DIAGNOSIS — Z0000 Encounter for general adult medical examination without abnormal findings: Secondary | ICD-10-CM

## 2023-11-03 DIAGNOSIS — G43009 Migraine without aura, not intractable, without status migrainosus: Secondary | ICD-10-CM | POA: Diagnosis not present

## 2023-11-03 DIAGNOSIS — E559 Vitamin D deficiency, unspecified: Secondary | ICD-10-CM

## 2023-11-03 DIAGNOSIS — S56511A Strain of other extensor muscle, fascia and tendon at forearm level, right arm, initial encounter: Secondary | ICD-10-CM

## 2023-11-03 DIAGNOSIS — R519 Headache, unspecified: Secondary | ICD-10-CM

## 2023-11-03 DIAGNOSIS — M7711 Lateral epicondylitis, right elbow: Secondary | ICD-10-CM

## 2023-11-03 LAB — COMPREHENSIVE METABOLIC PANEL WITH GFR
ALT: 49 U/L — ABNORMAL HIGH (ref 0–35)
AST: 29 U/L (ref 0–37)
Albumin: 4.5 g/dL (ref 3.5–5.2)
Alkaline Phosphatase: 70 U/L (ref 39–117)
BUN: 10 mg/dL (ref 6–23)
CO2: 27 meq/L (ref 19–32)
Calcium: 9.4 mg/dL (ref 8.4–10.5)
Chloride: 105 meq/L (ref 96–112)
Creatinine, Ser: 0.78 mg/dL (ref 0.40–1.20)
GFR: 87.63 mL/min (ref >60.00)
Glucose, Bld: 97 mg/dL (ref 70–99)
Potassium: 3.8 meq/L (ref 3.5–5.1)
Sodium: 142 meq/L (ref 135–145)
Total Bilirubin: 0.5 mg/dL (ref 0.2–1.2)
Total Protein: 7.5 g/dL (ref 6.0–8.3)

## 2023-11-03 LAB — LIPID PANEL
Cholesterol: 226 mg/dL — ABNORMAL HIGH (ref 0–200)
HDL: 65.1 mg/dL (ref >39.00)
LDL Cholesterol: 143 mg/dL — ABNORMAL HIGH (ref 0–99)
NonHDL: 161.21
Total CHOL/HDL Ratio: 3
Triglycerides: 90 mg/dL (ref 0.0–149.0)
VLDL: 18 mg/dL (ref 0.0–40.0)

## 2023-11-03 LAB — URINALYSIS
Bilirubin Urine: NEGATIVE
Hgb urine dipstick: NEGATIVE
Ketones, ur: NEGATIVE
Leukocytes,Ua: NEGATIVE
Nitrite: NEGATIVE
Specific Gravity, Urine: >=1.03 — AB (ref 1.000–1.030)
Total Protein, Urine: NEGATIVE
Urine Glucose: NEGATIVE
Urobilinogen, UA: 0.2 (ref 0.0–1.0)
pH: 5.5 (ref 5.0–8.0)

## 2023-11-03 LAB — CBC WITH DIFFERENTIAL/PLATELET
Basophils Absolute: 0 K/uL (ref 0.0–0.1)
Basophils Relative: 0.8 % (ref 0.0–3.0)
Eosinophils Absolute: 0.2 K/uL (ref 0.0–0.7)
Eosinophils Relative: 4 % (ref 0.0–5.0)
HCT: 40.4 % (ref 36.0–46.0)
Hemoglobin: 13.3 g/dL (ref 12.0–15.0)
Lymphocytes Relative: 41.7 % (ref 12.0–46.0)
Lymphs Abs: 1.9 K/uL (ref 0.7–4.0)
MCHC: 33 g/dL (ref 30.0–36.0)
MCV: 88 fl (ref 78.0–100.0)
Monocytes Absolute: 0.3 K/uL (ref 0.1–1.0)
Monocytes Relative: 6.2 % (ref 3.0–12.0)
Neutro Abs: 2.1 K/uL (ref 1.4–7.7)
Neutrophils Relative %: 47.3 % (ref 43.0–77.0)
Platelets: 215 K/uL (ref 150.0–400.0)
RBC: 4.59 Mil/uL (ref 3.87–5.11)
RDW: 13.9 % (ref 11.5–15.5)
WBC: 4.5 K/uL (ref 4.0–10.5)

## 2023-11-03 LAB — TSH: TSH: 2.42 u[IU]/mL (ref 0.35–5.50)

## 2023-11-03 MED ORDER — CELECOXIB 200 MG PO CAPS: 200.0000 mg | ORAL_CAPSULE | Freq: Two times a day (BID) | ORAL | 0 refills | Status: AC

## 2023-11-03 MED ORDER — RIZATRIPTAN BENZOATE 10 MG PO TABS: 10.0000 mg | ORAL_TABLET | Freq: Once | ORAL | 11 refills | Status: AC | PRN

## 2023-11-03 NOTE — Assessment & Plan Note (Signed)
 Monitor TSH

## 2023-11-03 NOTE — Addendum Note (Signed)
 Addended byBETHA LUCETTA CLEATRICE LELON on: 11/03/2023 09:51 AM   Modules accepted: Orders

## 2023-11-03 NOTE — Progress Notes (Signed)
 [name] [address] [place] [address] Phone: [phone] Subjective:    I'm seeing this patient by the request  of:  Plotnikov, Karlynn [name], MD  CC: right elbow pain   SUBJECTIVE: Dawn [name] is a 53 y.o. female coming in with complaint of R elbow pain. PRP last visit. Patient states that her pain is worsening. Has some pain at rest now. Lifting dishes into cupboard or lifting heavy pots increases her pain. Pain over lateral aspect with some numbness over lateral epicondyle.       Past Medical History:  Diagnosis Date   Allergy    Frequent headaches    GERD (gastroesophageal reflux disease)    H/O sinus surgery    No past surgical history on file. Social History   Socioeconomic History   Marital status: Married    Spouse name: Not on file   Number of children: 3   Years of education: Not on file   Highest education level: Master's degree (e.g., MA, MS, MEng, MEd, MSW, MBA)  Occupational History   Not on file  Tobacco Use   Smoking status: Never   Smokeless tobacco: Never  Substance and Sexual Activity   Alcohol use: Yes   Drug use: No   Sexual activity: Yes    Birth control/protection: I.U.D.  Other Topics Concern   Not on file  Social History Narrative   Not on file   Social Drivers of Health   Financial Resource Strain: Low Risk  (10/28/2022)   Overall Financial Resource Strain (CARDIA)    Difficulty of Paying Living Expenses: Not hard at all  Food Insecurity: No Food Insecurity (10/28/2022)   Hunger Vital Sign    Worried About Running Out of Food in the Last Year: Never true    Ran Out of Food in the Last Year: Never true  Transportation Needs: No Transportation Needs (10/28/2022)   PRAPARE - Administrator, Civil Service (Medical): No    Lack of Transportation (Non-Medical): No  Physical Activity: Sufficiently Active (10/28/2022)   Exercise Vital Sign    Days of Exercise per Week: 3 days     Minutes of Exercise per Session: 90 min  Stress: Stress Concern Present (10/28/2022)   [employer] of Occupational Health - Occupational Stress Questionnaire    Feeling of Stress : To some extent  Social Connections: Moderately Integrated (10/28/2022)   Social Connection and Isolation Panel    Frequency of Communication with Friends and Family: More than three times a week    Frequency of Social Gatherings with Friends and Family: More than three times a week    Attends Religious Services: Never    Database administrator or Organizations: Yes    Attends Engineer, structural: More than 4 times per year    Marital Status: Married   Allergies  Allergen Reactions   Amoxicillin    Zonegran [Zonisamide]     Bad side affect   Family History  Problem Relation Age of Onset   Asthma Mother    Hyperlipidemia Mother    Cancer Maternal Grandmother    Hypertension Maternal Grandmother    Kidney disease Maternal Grandmother    Cancer Maternal Grandfather    Early death Paternal Grandmother    Diabetes Paternal Grandfather    Arthritis Paternal Grandfather    Hypertension Paternal Grandfather       Current Outpatient Medications (Respiratory):    loratadine  (CLARITIN ) 10 MG tablet, Take  1 tablet (10 mg total) by mouth daily.   loratadine -pseudoephedrine (CLARITIN -D 12 HOUR) 5-120 MG tablet, Take 1 tablet by mouth 2 (two) times daily as needed for allergies.  Current Outpatient Medications (Analgesics):    SUMAtriptan (IMITREX) 100 MG tablet, Take 100 mg by mouth every 2 (two) hours as needed for migraine.   Current Outpatient Medications (Other):    azithromycin  (ZITHROMAX  Z-PAK) 250 MG tablet, As directed   Cholecalciferol (VITAMIN D3) 2000 units capsule, Take 1 capsule (2,000 Units total) by mouth daily.   triamcinolone  (KENALOG ) 0.1 % paste, Use as directed 1 Application in the mouth or throat 2 (two) times daily.   zolpidem  (AMBIEN ) 10 MG tablet, Take 0.5-1 tablets  (5-10 mg total) by mouth at bedtime as needed for sleep.   Reviewed prior external information including notes and imaging from  primary care provider As well as notes that were available from care everywhere and other healthcare systems.  Past medical history, social, surgical and family history all reviewed in electronic medical record.  No pertanent information unless stated regarding to the chief complaint.   Review of Systems:  No headache, visual changes, nausea, vomiting, diarrhea, constipation, dizziness, abdominal pain, skin rash, fevers, chills, night sweats, weight loss, swollen lymph nodes, body aches, joint swelling, chest pain, shortness of breath, mood changes. POSITIVE muscle aches  Objective  Last menstrual period 06/16/2022. Vitals:   11/03/23 1133  BP: 112/72  Pulse: 78  SpO2: 94%      General: No apparent distress alert and oriented x3 mood and affect normal, dressed appropriately.  HEENT: Pupils equal, extraocular movements intact  Respiratory: Patient's speak in full sentences and does not appear short of breath  Cardiovascular: No lower extremity edema, non tender, no erythema  Elbow exam shows severely tender to palpation over the lateral epicondylar area.  Pain with resisted extension of the wrist.  Limited muscular skeletal ultrasound was performed and interpreted by [name] HUSSAR, M  Limited ultrasound still shows significant neovascularization and increase in Doppler flow of the common extensor tendon at the insertion of the lateral epicondylar area.  Hypoechoic changes still noted as well that is fairly severe. Impression: Less than appropriate healing aspect.   Impression and Recommendations:     The above documentation has been reviewed and is accurate and complete Damyn Weitzel M Lewi Drost, DO

## 2023-11-03 NOTE — Patient Instructions (Addendum)
 MRI R elbow [phone] Elaine for your daughter

## 2023-11-03 NOTE — Assessment & Plan Note (Addendum)
 Migraines PRN Maxalt

## 2023-11-03 NOTE — Assessment & Plan Note (Signed)
 Unfortunately not making the improvement we would anticipate after the PRP.  Significant hypoechoic changes still noted in the area where tearing was appreciated previously.  Do not appear to be a new tear.  Do feel that further evaluation with patient failing all formal physical therapy, home exercises, injections including PRP that advanced imaging is warranted at this time.  Follow-up again in several weeks to discuss other difficulties patient is having.

## 2023-11-03 NOTE — Progress Notes (Signed)
 Subjective:  Patient ID: Dawn [name], female    DOB: 1970-11-06  Age: 53 y.o. MRN: [id number]  CC: Annual Exam (Annual Exam)   HPI Dawn [name] presents for a well exam C/o R elbow pain x 6 mo, HAs  Outpatient Medications Prior to Visit  Medication Sig Dispense Refill   azithromycin  (ZITHROMAX  Z-PAK) 250 MG tablet As directed 6 tablet 0   Cholecalciferol (VITAMIN D3) 2000 units capsule Take 1 capsule (2,000 Units total) by mouth daily. 100 capsule 3   loratadine  (CLARITIN ) 10 MG tablet Take 1 tablet (10 mg total) by mouth daily. 90 tablet 3   loratadine -pseudoephedrine (CLARITIN -D 12 HOUR) 5-120 MG tablet Take 1 tablet by mouth 2 (two) times daily as needed for allergies. 60 tablet 1   triamcinolone  (KENALOG ) 0.1 % paste Use as directed 1 Application in the mouth or throat 2 (two) times daily. 5 g 12   zolpidem  (AMBIEN ) 10 MG tablet Take 0.5-1 tablets (5-10 mg total) by mouth at bedtime as needed for sleep. 30 tablet 1   SUMAtriptan (IMITREX) 100 MG tablet Take 100 mg by mouth every 2 (two) hours as needed for migraine.     No facility-administered medications prior to visit.    ROS: Review of Systems  Constitutional:  Negative for activity change, appetite change, chills, fatigue and unexpected weight change.  HENT:  Negative for congestion, mouth sores and sinus pressure.   Eyes:  Negative for visual disturbance.  Respiratory:  Negative for cough and chest tightness.   Gastrointestinal:  Negative for abdominal pain and nausea.  Genitourinary:  Negative for difficulty urinating, frequency and vaginal pain.  Musculoskeletal:  Positive for arthralgias. Negative for back pain and gait problem.  Skin:  Negative for pallor and rash.  Neurological:  Negative for dizziness, tremors, weakness, numbness and headaches.  Psychiatric/Behavioral:  Negative for confusion and sleep disturbance. The patient is not nervous/anxious.     Objective:  BP 116/82   Pulse 70   Temp  98.1 F (36.7 C)   Ht 5' 5 (1.651 m)   Wt 163 lb (73.9 kg)   LMP 06/16/2022 (Approximate)   SpO2 99%   BMI 27.12 kg/m   BP Readings from Last 3 Encounters:  11/03/23 116/82  09/10/23 106/72  09/07/23 112/70    Wt Readings from Last 3 Encounters:  11/03/23 163 lb (73.9 kg)  09/10/23 164 lb (74.4 kg)  09/07/23 163 lb (73.9 kg)    Physical Exam Constitutional:      General: She is not in acute distress.    Appearance: She is well-developed.  HENT:     Head: Normocephalic.     Right Ear: External ear normal.     Left Ear: External ear normal.     Nose: Nose normal.  Eyes:     General:        Right eye: No discharge.        Left eye: No discharge.     Conjunctiva/sclera: Conjunctivae normal.     Pupils: Pupils are equal, round, and reactive to light.  Neck:     Thyroid : No thyromegaly.     Vascular: No JVD.     Trachea: No tracheal deviation.  Cardiovascular:     Rate and Rhythm: Normal rate and regular rhythm.     Heart sounds: Normal heart sounds.  Pulmonary:     Effort: No respiratory distress.     Breath sounds: No stridor. No wheezing.  Abdominal:     General: Bowel  sounds are normal. There is no distension.     Palpations: Abdomen is soft. There is no mass.     Tenderness: There is no abdominal tenderness. There is no guarding or rebound.  Musculoskeletal:        General: No tenderness.     Cervical back: Normal range of motion and neck supple. No rigidity.  Lymphadenopathy:     Cervical: No cervical adenopathy.  Skin:    Findings: No erythema or rash.  Neurological:     Cranial Nerves: No cranial nerve deficit.     Motor: No abnormal muscle tone.     Coordination: Coordination normal.     Deep Tendon Reflexes: Reflexes normal.  Psychiatric:        Behavior: Behavior normal.        Thought Content: Thought content normal.        Judgment: Judgment normal.   R lat elbow w/pain  Lab Results  Component Value Date   WBC 4.2 10/29/2022   HGB 12.9  10/29/2022   HCT 39.8 10/29/2022   PLT 253.0 10/29/2022   GLUCOSE 95 10/29/2022   CHOL 221 (H) 10/29/2022   TRIG 69.0 10/29/2022   HDL 79.80 10/29/2022   LDLCALC 127 (H) 10/29/2022   ALT 12 10/29/2022   AST 18 10/29/2022   NA 143 10/29/2022   K 4.2 10/29/2022   CL 107 10/29/2022   CREATININE 0.76 10/29/2022   BUN 8 10/29/2022   CO2 27 10/29/2022   TSH 1.93 10/29/2022    MR BRAIN WO CONTRAST Result Date: 05/26/2022 CLINICAL DATA:  Provided history: Intractable headache, unspecified chronicity pattern, unspecified headache type. Additional history provided by the scanning technologist: The patient reports headaches for 30 years, symptoms worsening in the past one month, increased nausea. EXAM: MRI HEAD WITHOUT CONTRAST TECHNIQUE: Multiplanar, multiecho pulse sequences of the brain and surrounding structures were obtained without intravenous contrast. COMPARISON:  No pertinent prior exams available for comparison. FINDINGS: Brain: No age advanced or lobar predominant parenchymal atrophy. There are a several small nonspecific foci of T2 FLAIR hyperintense signal abnormality scattered within the bilateral cerebral white matter. No cortical encephalomalacia is identified. There is no acute infarct. No evidence of an intracranial mass. No chronic intracranial blood products. No extra-axial fluid collection. No midline shift. Vascular: Maintained flow voids within the proximal large arterial vessels. Skull and upper cervical spine: No focal suspicious marrow lesion. Sinuses/Orbits: No mass or acute finding within the imaged orbits. No significant paranasal sinus disease. IMPRESSION: 1.  No evidence of an acute intracranial abnormality. 2. Several small nonspecific foci of T2 FLAIR hyperintense signal abnormality within the bilateral cerebral white matter. These signal changes are nonspecific and differential considerations include chronic small vessel ischemic disease, sequelae of chronic migraine  headaches, sequelae of a prior infectious/inflammatory process or sequelae of demyelinating disease, among others. No lesions specifically suggest demyelinating disease. Electronically Signed   By: Rockey Childs D.O.   On: 05/26/2022 19:06    Assessment & Plan:   Problem List Items Addressed This Visit     Headache, unspecified headache type   Migraines PRN Maxalt      Relevant Medications   celecoxib (CELEBREX) 200 MG capsule   rizatriptan (MAXALT) 10 MG tablet   Hypothyroidism   Monitor TSH      Migraines   Relevant Medications   celecoxib (CELEBREX) 200 MG capsule   rizatriptan (MAXALT) 10 MG tablet   Right lateral epicondylitis   F/u w/Dr Claudene Celebrex Rx po  if ok w/Dr Claudene Try theraband flexbar, Blue Emu cream, Artemisia, heat      Vitamin D  deficiency   Re-start Vit D      Well adult exam - Primary    We discussed age appropriate health related issues, including available/recomended screening tests and vaccinations. Labs were ordered to be later reviewed . All questions were answered. We discussed one or more of the following - seat belt use, use of sunscreen/sun exposure exercise, safe sex, fall risk reduction, second hand smoke exposure, firearm use and storage, seat belt use, a need for adhering to healthy diet and exercise. Labs were ordered. All questions were answered. Colon Dr Kristie - 2022 - polyps, due in 2027 Eye exam q 12 mo - Dr R GYN q 12 mo Flu shot      Relevant Orders   TSH   Urinalysis   CBC with Differential/Platelet   Lipid panel   Comprehensive metabolic panel with GFR      Meds ordered this encounter  Medications   celecoxib (CELEBREX) 200 MG capsule    Sig: Take 1 capsule (200 mg total) by mouth 2 (two) times daily.    Dispense:  60 capsule    Refill:  0   rizatriptan (MAXALT) 10 MG tablet    Sig: Take 1 tablet (10 mg total) by mouth once as needed for up to 1 dose for migraine. May repeat in 2 hours if needed    Dispense:  12  tablet    Refill:  11      Follow-up: Return in about 3 months (around 02/03/2024) for a follow-up visit.  Marolyn Noel, MD

## 2023-11-03 NOTE — Assessment & Plan Note (Signed)
  We discussed age appropriate health related issues, including available/recomended screening tests and vaccinations. Labs were ordered to be later reviewed . All questions were answered. We discussed one or more of the following - seat belt use, use of sunscreen/sun exposure exercise, safe sex, fall risk reduction, second hand smoke exposure, firearm use and storage, seat belt use, a need for adhering to healthy diet and exercise. Labs were ordered. All questions were answered. Colon Dr Kristie - 2022 - polyps, due in 2027 Eye exam q 12 mo - Dr R GYN q 12 mo Flu shot

## 2023-11-03 NOTE — Assessment & Plan Note (Signed)
Re-start Vit D 

## 2023-11-03 NOTE — Assessment & Plan Note (Addendum)
 F/u w/Dr Claudene Celebrex Rx po if ok w/Dr Claudene Try theraband flexbar, Blue Emu cream, Artemisia, heat

## 2023-11-03 NOTE — Patient Instructions (Addendum)
 Try theraband flexbar, Blue Emu cream, Artemisia

## 2023-11-04 ENCOUNTER — Ambulatory Visit: Payer: Self-pay | Admitting: Internal Medicine

## 2023-11-12 ENCOUNTER — Ambulatory Visit
Admission: RE | Admit: 2023-11-12 | Discharge: 2023-11-12 | Disposition: A | Discharge status: Home / Self Care | Source: Ambulatory Visit | Attending: Family Medicine | Admitting: Family Medicine

## 2023-11-12 DIAGNOSIS — M25521 Pain in right elbow: Secondary | ICD-10-CM

## 2023-11-14 ENCOUNTER — Ambulatory Visit: Payer: Self-pay | Admitting: Family Medicine

## 2023-11-23 ENCOUNTER — Other Ambulatory Visit: Payer: Self-pay | Admitting: Medical Genetics

## 2023-11-23 DIAGNOSIS — Z006 Encounter for examination for normal comparison and control in clinical research program: Secondary | ICD-10-CM

## 2023-11-30 NOTE — Progress Notes (Unsigned)
 [name] [address] [place] [address] Phone: [phone] Subjective:    I'm seeing this patient by the request  of:  Plotnikov, Karlynn [name], MD  CC: Elbow and back pain follow-up  YEP:Dlagzrupcz  11/03/2023 Unfortunately not making the improvement we would anticipate after the PRP. Significant hypoechoic changes still noted in the area where tearing was appreciated previously. Do not appear to be a new tear. Do feel that further evaluation with patient failing all formal physical therapy, home exercises, injections including PRP that advanced imaging is warranted at this time. Follow-up again in several weeks to discuss other difficulties patient is having.   Update 12/01/2023 Dawn [name] is a 53 y.o. female coming in with complaint of R elbow pain. Patient states pain is calming down, but still significant.   MRI R elbow 11/12/2023 IMPRESSION: Tendinosis of the common tendon of the forearm extensor muscles at the lateral epicondylar origin. Superimposed partial-thickness tear at the lateral epicondylar origin can not be excluded.     Past Medical History:  Diagnosis Date   Allergy    Frequent headaches    GERD (gastroesophageal reflux disease)    H/O sinus surgery    No past surgical history on file. Social History   Socioeconomic History   Marital status: Married    Spouse name: Not on file   Number of children: 3   Years of education: Not on file   Highest education level: Master's degree (e.g., MA, MS, MEng, MEd, MSW, MBA)  Occupational History   Not on file  Tobacco Use   Smoking status: Never   Smokeless tobacco: Never  Substance and Sexual Activity   Alcohol use: Yes   Drug use: No   Sexual activity: Yes    Birth control/protection: I.U.D.  Other Topics Concern   Not on file  Social History Narrative   Not on file   Social Drivers of Health   Financial Resource Strain: Low Risk  (10/28/2022)   Overall  Financial Resource Strain (CARDIA)    Difficulty of Paying Living Expenses: Not hard at all  Food Insecurity: No Food Insecurity (10/28/2022)   Hunger Vital Sign    Worried About Running Out of Food in the Last Year: Never true    Ran Out of Food in the Last Year: Never true  Transportation Needs: No Transportation Needs (10/28/2022)   PRAPARE - Administrator, Civil Service (Medical): No    Lack of Transportation (Non-Medical): No  Physical Activity: Sufficiently Active (10/28/2022)   Exercise Vital Sign    Days of Exercise per Week: 3 days    Minutes of Exercise per Session: 90 min  Stress: Stress Concern Present (10/28/2022)   [employer] of Occupational Health - Occupational Stress Questionnaire    Feeling of Stress : To some extent  Social Connections: Moderately Integrated (10/28/2022)   Social Connection and Isolation Panel    Frequency of Communication with Friends and Family: More than three times a week    Frequency of Social Gatherings with Friends and Family: More than three times a week    Attends Religious Services: Never    Database Administrator or Organizations: Yes    Attends Engineer, Structural: More than 4 times per year    Marital Status: Married   Allergies  Allergen Reactions   Amoxicillin    Zonegran [Zonisamide]     Bad side affect   Family History  Problem Relation Age  of Onset   Asthma Mother    Hyperlipidemia Mother    Cancer Maternal Grandmother    Hypertension Maternal Grandmother    Kidney disease Maternal Grandmother    Cancer Maternal Grandfather    Early death Maternal Grandfather    Early death Paternal Grandmother    Diabetes Paternal Grandmother    Diabetes Paternal Grandfather    Arthritis Paternal Grandfather    Hypertension Paternal Grandfather       Current Outpatient Medications (Respiratory):    loratadine  (CLARITIN ) 10 MG tablet, Take 1 tablet (10 mg total) by mouth daily.    loratadine -pseudoephedrine (CLARITIN -D 12 HOUR) 5-120 MG tablet, Take 1 tablet by mouth 2 (two) times daily as needed for allergies.  Current Outpatient Medications (Analgesics):    celecoxib (CELEBREX) 200 MG capsule, Take 1 capsule (200 mg total) by mouth 2 (two) times daily.   rizatriptan (MAXALT) 10 MG tablet, Take 1 tablet (10 mg total) by mouth once as needed for up to 1 dose for migraine. May repeat in 2 hours if needed   Current Outpatient Medications (Other):    azithromycin  (ZITHROMAX  Z-PAK) 250 MG tablet, As directed   Cholecalciferol (VITAMIN D3) 2000 units capsule, Take 1 capsule (2,000 Units total) by mouth daily.   triamcinolone  (KENALOG ) 0.1 % paste, Use as directed 1 Application in the mouth or throat 2 (two) times daily.   zolpidem  (AMBIEN ) 10 MG tablet, Take 0.5-1 tablets (5-10 mg total) by mouth at bedtime as needed for sleep.   Reviewed prior external information including notes and imaging from  primary care provider As well as notes that were available from care everywhere and other healthcare systems.  Past medical history, social, surgical and family history all reviewed in electronic medical record.  No pertanent information unless stated regarding to the chief complaint.   Review of Systems:  No headache, visual changes, nausea, vomiting, diarrhea, constipation, dizziness, abdominal pain, skin rash, fevers, chills, night sweats, weight loss, swollen lymph nodes, body aches, joint swelling, chest pain, shortness of breath, mood changes. POSITIVE muscle aches  Objective  Blood pressure 120/82, height 5' 5 (1.651 m), weight 172 lb (78 kg), last menstrual period 06/16/2022.   General: No apparent distress alert and oriented x3 mood and affect normal, dressed appropriately.  HEENT: Pupils equal, extraocular movements intact  Respiratory: Patient's speak in full sentences and does not appear short of breath  Cardiovascular: No lower extremity edema, non tender, no  erythema  Elbow exam shows tenderness to palpation still over the lateral epicondylar area.  Severe discomfort with resisted extension noted. Neurovascular intact distally.  Low back exam does have some loss lordosis noted.  Some tenderness to palpation in the paraspinal musculature.  Procedure: Real-time Ultrasound Guided Injection of right common extensor tendon sheath Device: GE Logiq Q7 Ultrasound guided injection is preferred based studies that show increased duration, increased effect, greater accuracy, decreased procedural pain, increased response rate, and decreased cost with ultrasound guided versus blind injection.  Verbal informed consent obtained.  Time-out conducted.  Noted no overlying erythema, induration, or other signs of local infection.  Skin prepped in a sterile fashion.  Local anesthesia: Topical Ethyl chloride.  With sterile technique and under real time ultrasound guidance: With a 25-gauge 1 inch needle injected with 0.5 cc of 0.5% Marcaine and 0.5 cc of Depo-Medrol  40 mg/mL Completed without difficulty  Pain immediately resolved suggesting accurate placement of the medication.  Advised to call if fevers/chills, erythema, induration, drainage, or persistent bleeding.  Images  saved Impression: Technically successful ultrasound guided injection.  Osteopathic findings C2 flexed rotated and side bent right C3 flexed rotated and side bent left C7 flexed rotated and side bent right T3 extended rotated and side bent right inhaled third rib T9 extended rotated and side bent left L2 flexed rotated and side bent right Sacrum right on right    Impression and Recommendations:  Right lateral epicondylitis Injection given and patient tolerated the procedure well, discussed icing regimen and home exercises, discussed which activities to do and which ones to avoid.  Patient will take it easy for the next 72 hours and increase slowly.  I discussed with patient about icing  regimen still as well as compression.  Follow-up again in 6 to 8 weeks  Lumbar radiculopathy Chronic discomfort noted discussed with patient about icing regimen and home exercises, discussed with patient about continuing to work on core strength.  Increase activity slowly.  Follow-up again in 6 to 8 weeks    Decision today to treat with OMT was based on Physical Exam  After verbal consent patient was treated with HVLA, ME, FPR techniques in cervical, thoracic, rib, lumbar and sacral areas, all areas are chronic   Patient tolerated the procedure well with improvement in symptoms  Patient given exercises, stretches and lifestyle modifications  See medications in patient instructions if given  Patient will follow up in 4-8 weeks   The above documentation has been reviewed and is accurate and complete Leanah Kolander M Raesean Bartoletti, DO

## 2023-12-01 ENCOUNTER — Encounter: Payer: Self-pay | Admitting: Family Medicine

## 2023-12-01 ENCOUNTER — Other Ambulatory Visit: Payer: Self-pay

## 2023-12-01 ENCOUNTER — Ambulatory Visit: Admitting: Family Medicine

## 2023-12-01 VITALS — BP 120/82 | Ht 65.0 in | Wt 172.0 lb

## 2023-12-01 DIAGNOSIS — M5416 Radiculopathy, lumbar region: Secondary | ICD-10-CM | POA: Diagnosis not present

## 2023-12-01 DIAGNOSIS — M7711 Lateral epicondylitis, right elbow: Secondary | ICD-10-CM | POA: Diagnosis not present

## 2023-12-01 DIAGNOSIS — M9902 Segmental and somatic dysfunction of thoracic region: Secondary | ICD-10-CM

## 2023-12-01 DIAGNOSIS — M25521 Pain in right elbow: Secondary | ICD-10-CM | POA: Diagnosis not present

## 2023-12-01 DIAGNOSIS — M9901 Segmental and somatic dysfunction of cervical region: Secondary | ICD-10-CM

## 2023-12-01 DIAGNOSIS — M9904 Segmental and somatic dysfunction of sacral region: Secondary | ICD-10-CM | POA: Diagnosis not present

## 2023-12-01 DIAGNOSIS — M9908 Segmental and somatic dysfunction of rib cage: Secondary | ICD-10-CM

## 2023-12-01 DIAGNOSIS — M9903 Segmental and somatic dysfunction of lumbar region: Secondary | ICD-10-CM

## 2023-12-01 NOTE — Patient Instructions (Signed)
 Good to see you   Ice after activity   Elbow compression   Update me in 2 weeks see me in 2 months

## 2023-12-01 NOTE — Assessment & Plan Note (Signed)
 Injection given and patient tolerated the procedure well, discussed icing regimen and home exercises, discussed which activities to do and which ones to avoid.  Patient will take it easy for the next 72 hours and increase slowly.  I discussed with patient about icing regimen still as well as compression.  Follow-up again in 6 to 8 weeks

## 2023-12-01 NOTE — Assessment & Plan Note (Signed)
 Chronic discomfort noted discussed with patient about icing regimen and home exercises, discussed with patient about continuing to work on core strength.  Increase activity slowly.  Follow-up again in 6 to 8 weeks

## 2023-12-02 ENCOUNTER — Ambulatory Visit
Admission: RE | Admit: 2023-12-02 | Discharge: 2023-12-02 | Disposition: A | Discharge status: Home / Self Care | Source: Ambulatory Visit | Attending: Obstetrics and Gynecology | Admitting: Obstetrics and Gynecology

## 2023-12-02 ENCOUNTER — Ambulatory Visit

## 2023-12-02 DIAGNOSIS — Z1231 Encounter for screening mammogram for malignant neoplasm of breast: Secondary | ICD-10-CM

## 2024-01-25 NOTE — Progress Notes (Deleted)
" °  [name] [address] [place] [address] Phone: [phone] Subjective:    I'm seeing this patient by the request  of:  Plotnikov, Karlynn [name], MD  CC:   YEP:Dlagzrupcz  Carma Bowron is a 53 y.o. female coming in with complaint of back and neck pain. OMT 12/01/2023. Also f/u for R elbow pain. Patient states   Medications patient has been prescribed: None  Taking:         Reviewed prior external information including notes and imaging from previsou exam, outside providers and external EMR if available.   As well as notes that were available from care everywhere and other healthcare systems.  Past medical history, social, surgical and family history all reviewed in electronic medical record.  No pertanent information unless stated regarding to the chief complaint.   Past Medical History:  Diagnosis Date   Allergy    Frequent headaches    GERD (gastroesophageal reflux disease)    H/O sinus surgery     Allergies[1]   Review of Systems:  No headache, visual changes, nausea, vomiting, diarrhea, constipation, dizziness, abdominal pain, skin rash, fevers, chills, night sweats, weight loss, swollen lymph nodes, body aches, joint swelling, chest pain, shortness of breath, mood changes. POSITIVE muscle aches  Objective  Last menstrual period 06/16/2022.   General: No apparent distress alert and oriented x3 mood and affect normal, dressed appropriately.  HEENT: Pupils equal, extraocular movements intact  Respiratory: Patient's speak in full sentences and does not appear short of breath  Cardiovascular: No lower extremity edema, non tender, no erythema  Gait MSK:  Back   Osteopathic findings  C2 flexed rotated and side bent right C6 flexed rotated and side bent left T3 extended rotated and side bent right inhaled rib T9 extended rotated and side bent left L2 flexed rotated and side bent right Sacrum right on right        Assessment and Plan:  No problem-specific Assessment & Plan notes found for this encounter.    Nonallopathic problems  Decision today to treat with OMT was based on Physical Exam  After verbal consent patient was treated with HVLA, ME, FPR techniques in cervical, rib, thoracic, lumbar, and sacral  areas  Patient tolerated the procedure well with improvement in symptoms  Patient given exercises, stretches and lifestyle modifications  See medications in patient instructions if given  Patient will follow up in 4-8 weeks             Note: This dictation was prepared with Dragon dictation along with smaller phrase technology. Any transcriptional errors that result from this process are unintentional.            [1]  Allergies Allergen Reactions   Amoxicillin    Zonegran [Zonisamide]     Bad side affect   "

## 2024-01-31 ENCOUNTER — Ambulatory Visit: Admitting: Family Medicine

## 2024-02-08 ENCOUNTER — Encounter: Payer: Self-pay | Admitting: Diagnostic Neuroimaging

## 2024-02-08 ENCOUNTER — Ambulatory Visit: Admitting: Diagnostic Neuroimaging

## 2024-02-08 VITALS — BP 120/76 | HR 86 | Ht 66.0 in | Wt 172.4 lb

## 2024-02-08 DIAGNOSIS — G43109 Migraine with aura, not intractable, without status migrainosus: Secondary | ICD-10-CM

## 2024-02-08 NOTE — Progress Notes (Signed)
 SABRA

## 2024-02-08 NOTE — Progress Notes (Signed)
 "  [facility]  PATIENT: Dawn [name] DOB: January 26, 1971  REFERRING CLINICIAN: Plotnikov, Karlynn [name], MD HISTORY FROM: patient  REASON FOR VISIT: new consult               HISTORICAL  CHIEF COMPLAINT:  Chief Complaint  Patient presents with   RM 7     Patient is here alone for migraines - severe headaches sometimes feel like a hat or dull pains or sharp pains starting from the back of the head, nausea, and feels like they are being squeezed or heavy tired eyes, sometimes can hear her ear drums     HISTORY OF PRESENT ILLNESS:   54 year old female here for evaluation of migraine headache.  Patient started with migraine headaches in her early 47s.  She describes bandlike squeezing sensation, needle in the eye sensation, throbbing pain, right left-sided pain, associate with nausea and sensitivity light.  Triggers would include flickering lights or sunlight.  She tried a variety of medications for migraine treatment including zonisamide and they remain but these did not help and caused side effects.  At age 11 years old had episode of seeing a light explosion sensation and then a dark spot lasting for a minute.  Has had some left ear drum beating sounds over time.  Averaging 1-2 minor headaches per week.  Has about 1 or 2 major headaches per month.  Was also referred here for evaluation of safety consideration of hormone replacement therapy in the setting of migraine with aura.   REVIEW OF SYSTEMS: Full 14 system review of systems performed and negative with exception of: as per HPI.  ALLERGIES: Allergies[1]  HOME MEDICATIONS: Outpatient Medications Prior to Visit  Medication Sig Dispense Refill   celecoxib  (CELEBREX ) 200 MG capsule Take 1 capsule (200 mg total) by mouth 2 (two) times daily. 60 capsule 0   Cholecalciferol (VITAMIN D3) 2000 units capsule Take 1 capsule (2,000 Units total) by mouth daily. 100 capsule 3   loratadine  (CLARITIN ) 10 MG tablet  Take 1 tablet (10 mg total) by mouth daily. 90 tablet 3   loratadine -pseudoephedrine (CLARITIN -D 12 HOUR) 5-120 MG tablet Take 1 tablet by mouth 2 (two) times daily as needed for allergies. 60 tablet 1   rizatriptan  (MAXALT ) 10 MG tablet Take 1 tablet (10 mg total) by mouth once as needed for up to 1 dose for migraine. May repeat in 2 hours if needed 12 tablet 11   triamcinolone  (KENALOG ) 0.1 % paste Use as directed 1 Application in the mouth or throat 2 (two) times daily. 5 g 12   zolpidem  (AMBIEN ) 10 MG tablet Take 0.5-1 tablets (5-10 mg total) by mouth at bedtime as needed for sleep. 30 tablet 1   azithromycin  (ZITHROMAX  Z-PAK) 250 MG tablet As directed (Patient not taking: Reported on 02/08/2024) 6 tablet 0   No facility-administered medications prior to visit.    PAST MEDICAL HISTORY: Past Medical History:  Diagnosis Date   Allergy    Frequent headaches    GERD (gastroesophageal reflux disease)    H/O sinus surgery     PAST SURGICAL HISTORY: No past surgical history on file.  FAMILY HISTORY: Family History  Problem Relation Age of Onset   Asthma Mother    Hyperlipidemia Mother    Cancer Maternal Grandmother    Hypertension Maternal Grandmother    Kidney disease Maternal Grandmother    Cancer Maternal Grandfather    Early death Maternal Grandfather    Early death Paternal Grandmother  Diabetes Paternal Grandmother    Epilepsy Paternal Grandfather    Diabetes Paternal Grandfather    Arthritis Paternal Grandfather    Hypertension Paternal Grandfather    Breast cancer Neg Hx    Migraines Neg Hx    Seizures Neg Hx    Stroke Neg Hx    Sleep apnea Neg Hx     SOCIAL HISTORY: Social History   Socioeconomic History   Marital status: Married    Spouse name: Not on file   Number of children: 3   Years of education: Not on file   Highest education level: Master's degree (e.g., MA, MS, MEng, MEd, MSW, MBA)  Occupational History   Not on file  Tobacco Use   Smoking  status: Never   Smokeless tobacco: Never  Substance and Sexual Activity   Alcohol use: Yes   Drug use: No   Sexual activity: Yes    Birth control/protection: I.U.D.  Other Topics Concern   Not on file  Social History Narrative   1-2 cups of coffee, black tea or green tea in the afternoon nothing past 4pm    Social Drivers of Health   Tobacco Use: Low Risk (12/01/2023)   Patient History    Smoking Tobacco Use: Never    Smokeless Tobacco Use: Never    Passive Exposure: Not on file  Financial Resource Strain: Low Risk (10/28/2022)   Overall Financial Resource Strain (CARDIA)    Difficulty of Paying Living Expenses: Not hard at all  Food Insecurity: No Food Insecurity (10/28/2022)   Hunger Vital Sign    Worried About Running Out of Food in the Last Year: Never true    Ran Out of Food in the Last Year: Never true  Transportation Needs: No Transportation Needs (10/28/2022)   PRAPARE - Administrator, Civil Service (Medical): No    Lack of Transportation (Non-Medical): No  Physical Activity: Sufficiently Active (10/28/2022)   Exercise Vital Sign    Days of Exercise per Week: 3 days    Minutes of Exercise per Session: 90 min  Stress: Stress Concern Present (10/28/2022)   [employer] of Occupational Health - Occupational Stress Questionnaire    Feeling of Stress : To some extent  Social Connections: Moderately Integrated (10/28/2022)   Social Connection and Isolation Panel    Frequency of Communication with Friends and Family: More than three times a week    Frequency of Social Gatherings with Friends and Family: More than three times a week    Attends Religious Services: Never    Database Administrator or Organizations: Yes    Attends Engineer, Structural: More than 4 times per year    Marital Status: Married  Catering Manager Violence: Not on file  Depression (PHQ2-9): Low Risk (10/28/2022)   Depression (PHQ2-9)    PHQ-2 Score: 0  Alcohol Screen: Low Risk  (10/28/2022)   Alcohol Screen    Last Alcohol Screening Score (AUDIT): 2  Housing: Low Risk (10/28/2022)   Housing    Last Housing Risk Score: 0  Utilities: Not on file  Health Literacy: Not on file     PHYSICAL EXAM  GENERAL EXAM/CONSTITUTIONAL: Vitals:  Vitals:   02/08/24 0928  BP: 120/76  Pulse: 86  SpO2: 99%  Weight: 172 lb 6.4 oz (78.2 kg)  Height: 5' 6 (1.676 m)   Body mass index is 27.83 kg/m. Wt Readings from Last 3 Encounters:  02/08/24 172 lb 6.4 oz (78.2 kg)  12/01/23 172 lb (  78 kg)  11/03/23 164 lb (74.4 kg)   Patient is in no distress; well developed, nourished and groomed; neck is supple  CARDIOVASCULAR: Examination of carotid arteries is normal; no carotid bruits Regular rate and rhythm, no murmurs Examination of peripheral vascular system by observation and palpation is normal  EYES: Ophthalmoscopic exam of optic discs and posterior segments is normal; no papilledema or hemorrhages No results found.  MUSCULOSKELETAL: Gait, strength, tone, movements noted in Neurologic exam below  NEUROLOGIC: MENTAL STATUS:      No data to display         awake, alert, oriented to person, place and time recent and remote memory intact normal attention and concentration language fluent, comprehension intact, naming intact fund of knowledge appropriate  CRANIAL NERVE:  2nd - no papilledema on fundoscopic exam 2nd, 3rd, 4th, 6th - pupils equal and reactive to light, visual fields full to confrontation, extraocular muscles intact, no nystagmus 5th - facial sensation symmetric 7th - facial strength symmetric 8th - hearing intact 9th - palate elevates symmetrically, uvula midline 11th - shoulder shrug symmetric 12th - tongue protrusion midline  MOTOR:  normal bulk and tone, full strength in the BUE, BLE  SENSORY:  normal and symmetric to light touch, temperature, vibration  COORDINATION:  finger-nose-finger, fine finger movements normal  REFLEXES:   deep tendon reflexes present and symmetric  GAIT/STATION:  narrow based gait     DIAGNOSTIC DATA (LABS, IMAGING, TESTING) - I reviewed patient records, labs, notes, testing and imaging myself where available.  Lab Results  Component Value Date   WBC 4.5 11/03/2023   HGB 13.3 11/03/2023   HCT 40.4 11/03/2023   MCV 88.0 11/03/2023   PLT 215.0 11/03/2023      Component Value Date/Time   NA 142 11/03/2023 0852   K 3.8 11/03/2023 0852   CL 105 11/03/2023 0852   CO2 27 11/03/2023 0852   GLUCOSE 97 11/03/2023 0852   BUN 10 11/03/2023 0852   CREATININE 0.78 11/03/2023 0852   CALCIUM 9.4 11/03/2023 0852   PROT 7.5 11/03/2023 0852   ALBUMIN 4.5 11/03/2023 0852   AST 29 11/03/2023 0852   ALT 49 (H) 11/03/2023 0852   ALKPHOS 70 11/03/2023 0852   BILITOT 0.5 11/03/2023 0852   Lab Results  Component Value Date   CHOL 226 (H) 11/03/2023   HDL 65.10 11/03/2023   LDLCALC 143 (H) 11/03/2023   TRIG 90.0 11/03/2023   CHOLHDL 3 11/03/2023   No results found for: HGBA1C Lab Results  Component Value Date   VITAMINB12 517 10/29/2022   Lab Results  Component Value Date   TSH 2.42 11/03/2023    05/23/22 MRI brain [I reviewed images myself and agree with interpretation. -VRP]  1.  No evidence of an acute intracranial abnormality. 2. Several small nonspecific foci of T2 FLAIR hyperintense signal abnormality within the bilateral cerebral white matter. These signal changes are nonspecific and differential considerations include chronic small vessel ischemic disease, sequelae of chronic migraine headaches, sequelae of a prior infectious/inflammatory process or sequelae of demyelinating disease, among others. No lesions specifically suggest demyelinating disease.    ASSESSMENT AND PLAN  54 y.o. year old female here with:   Dx:  1. Migraine with aura and without status migrainosus, not intractable     PLAN:  MIGRAINE WITH AURA  MIGRAINE TREATMENT PLAN:  MIGRAINE  PREVENTION (~1-2 major HA per month) LIFESTYLE CHANGES -Avoid smoking -Decrease or avoid caffeine / alcohol -Eat and sleep on a regular schedule -  Exercise several times per week -May consider other migraine prevention if headaches become more frequent in the future  MIGRAINE RESCUE  - ibuprofen, tylenol as needed - rizatriptan  (Maxalt ) 10mg  as needed for breakthrough headache; may repeat x 1 after 2 hours; max 2 tabs per day or 8 per month   MIGRAINE WITH AURA + HRT for menopausal symptoms - patient at low risk for stroke; continue vascular risk factor screening and mgmt per PCP Estrogen use acceptable - Menopausal estrogen therapy is acceptable for patients with migraine with or without aura and otherwise at low risk of cardiovascular disease (ie, <=10 percent 10-year risk).  Migraine-associated stroke: risk factors, diagnosis, and prevention - UpToDate   Return for return to PCP, pending if symptoms worsen or fail to improve.     EDUARD FABIENE HANLON, MD 02/08/2024, 10:03 AM Certified in Neurology, Neurophysiology and Neuroimaging  [facility] [address], [address] [place], [place] [address] [phone]     [1]  Allergies Allergen Reactions   Amoxicillin    Zonegran [Zonisamide]     Bad side affect   "

## 2024-02-08 NOTE — Patient Instructions (Signed)
 MIGRAINE WITH AURA  MIGRAINE TREATMENT PLAN:  MIGRAINE PREVENTION  LIFESTYLE CHANGES -Avoid smoking -Decrease or avoid caffeine / alcohol -Eat and sleep on a regular schedule -Exercise several times per week  MIGRAINE RESCUE  - ibuprofen, tylenol as needed - rizatriptan  (Maxalt ) 10mg  as needed for breakthrough headache; may repeat x 1 after 2 hours; max 2 tabs per day or 8 per month

## 2024-02-14 ENCOUNTER — Encounter: Payer: Self-pay | Admitting: Diagnostic Neuroimaging

## 2024-03-07 NOTE — Progress Notes (Unsigned)
 " [name] [address] [place] [address] Phone: [phone] Subjective:   LILLETTE Berwyn Posey, am serving as a scribe for Dr. Arthea Claudene.  I'm seeing this patient by the request  of:  Plotnikov, Karlynn [name], MD  CC: Shoulder pain, elbow pain back pain follow-up  YEP:Dlagzrupcz  Harvey Delcarlo is a 54 y.o. female coming in with complaint of back and neck pain. OMT 12/01/2023. Also f/u for R elbow pain. Patient states that her elbow is doing much better. Has soreness intermittently.  Patient was suppose to come in December but traveled out of town and cancelled appt. Was cleaning right before christmas and developed pain in lower back. One week ago her pain worsened but over the past week her pain has improved. Was having pain while sleeping. Pain over SI joint on both sides.    Medications patient has been prescribed: None  Taking:  Patient has been seen by neurology recently for migraines     Regarding patient's elbow was given an injection in October 29 in the common extensor sheath  Reviewed prior external information including notes and imaging from previsou exam, outside providers and external EMR if available.   As well as notes that were available from care everywhere and other healthcare systems.  Past medical history, social, surgical and family history all reviewed in electronic medical record.  No pertanent information unless stated regarding to the chief complaint.   Past Medical History:  Diagnosis Date   Allergy    Frequent headaches    GERD (gastroesophageal reflux disease)    H/O sinus surgery     Allergies[1]   Review of Systems:  No headache, visual changes, nausea, vomiting, diarrhea, constipation, dizziness, abdominal pain, skin rash, fevers, chills, night sweats, weight loss, swollen lymph nodes, body aches, joint swelling, chest pain, shortness of breath, mood changes. POSITIVE muscle aches  Objective  Blood  pressure 118/82, pulse 65, height 5' 6 (1.676 m), weight 176 lb (79.8 kg), last menstrual period 06/16/2022, SpO2 98%.   General: No apparent distress alert and oriented x3 mood and affect normal, dressed appropriately.  HEENT: Pupils equal, extraocular movements intact  Respiratory: Patient's speak in full sentences and does not appear short of breath  Cardiovascular: No lower extremity edema, non tender, no erythema  Gait relatively normal MSK:  Back mild loss of lordosis.  Tightness noted in the paraspinal musculature and in the parascapular area.  Tender to palpation in the paraspinal musculature.  Of the lower back as well.  Negative straight leg test noted. Right elbow nontender on exam today.  Good range of motion noted.  Good grip strength.  Limited muscular skeletal ultrasound was performed and interpreted by CLAUDENE ARTHEA, M  Limited ultrasound shows no significant hypoechoic changes noted at this time.  Patient has very mild hypoechoic changes of the insertion of the common extensor tendon at the lateral epicondyle area. Osteopathic findings  C3 flexed rotated and side bent right C6 flexed rotated and side bent left T3 extended rotated and side bent right inhaled rib T9 extended rotated and side bent left L2 flexed rotated and side bent right L3 flexed rotated and side bent left Sacrum right on right       Assessment and Plan:  Right lateral epicondylitis Significant improvement at this time.  No other changes in management.  Okay to start increasing activity including tennis.  Follow-up again in 6 to 8 weeks  Low back pain Chronic, with some  tightness.  Discussed which activities to do and which ones to avoid.  Increase activity slowly.  Discussed home exercises.  Follow-up with me again in 6 to 8 weeks.    Nonallopathic problems  Decision today to treat with OMT was based on Physical Exam  After verbal consent patient was treated with HVLA, ME, FPR techniques in  cervical, rib, thoracic, lumbar, and sacral  areas  Patient tolerated the procedure well with improvement in symptoms  Patient given exercises, stretches and lifestyle modifications  See medications in patient instructions if given  Patient will follow up in 4-8 weeks    The above documentation has been reviewed and is accurate and complete Katelynd Blauvelt M Jaikob Borgwardt, DO          Note: This dictation was prepared with Dragon dictation along with smaller phrase technology. Any transcriptional errors that result from this process are unintentional.            [1]  Allergies Allergen Reactions   Amoxicillin    Zonegran [Zonisamide]     Bad side affect   "

## 2024-03-09 ENCOUNTER — Ambulatory Visit: Admitting: Family Medicine

## 2024-03-09 ENCOUNTER — Other Ambulatory Visit: Payer: Self-pay

## 2024-03-09 ENCOUNTER — Encounter: Payer: Self-pay | Admitting: Family Medicine

## 2024-03-09 VITALS — BP 118/82 | HR 65 | Ht 66.0 in | Wt 176.0 lb

## 2024-03-09 DIAGNOSIS — M9908 Segmental and somatic dysfunction of rib cage: Secondary | ICD-10-CM

## 2024-03-09 DIAGNOSIS — M7711 Lateral epicondylitis, right elbow: Secondary | ICD-10-CM | POA: Diagnosis not present

## 2024-03-09 DIAGNOSIS — G8929 Other chronic pain: Secondary | ICD-10-CM | POA: Diagnosis not present

## 2024-03-09 DIAGNOSIS — M9903 Segmental and somatic dysfunction of lumbar region: Secondary | ICD-10-CM | POA: Diagnosis not present

## 2024-03-09 DIAGNOSIS — M9901 Segmental and somatic dysfunction of cervical region: Secondary | ICD-10-CM

## 2024-03-09 DIAGNOSIS — M25521 Pain in right elbow: Secondary | ICD-10-CM | POA: Diagnosis not present

## 2024-03-09 DIAGNOSIS — M545 Low back pain, unspecified: Secondary | ICD-10-CM | POA: Diagnosis not present

## 2024-03-09 DIAGNOSIS — M9904 Segmental and somatic dysfunction of sacral region: Secondary | ICD-10-CM

## 2024-03-09 DIAGNOSIS — M9902 Segmental and somatic dysfunction of thoracic region: Secondary | ICD-10-CM | POA: Diagnosis not present

## 2024-03-09 NOTE — Patient Instructions (Signed)
 Great to see you You are gonna kill it in tennis season See me in 6-8 weeks

## 2024-03-09 NOTE — Assessment & Plan Note (Signed)
 Significant improvement at this time.  No other changes in management.  Okay to start increasing activity including tennis.  Follow-up again in 6 to 8 weeks

## 2024-03-09 NOTE — Assessment & Plan Note (Signed)
 Chronic, with some tightness.  Discussed which activities to do and which ones to avoid.  Increase activity slowly.  Discussed home exercises.  Follow-up with me again in 6 to 8 weeks.

## 2024-04-24 ENCOUNTER — Ambulatory Visit: Admitting: Family Medicine

## 2024-06-12 ENCOUNTER — Ambulatory Visit: Admitting: Neurology
